# Patient Record
Sex: Female | Born: 1980 | Race: White | Hispanic: No | Marital: Single | Smoking: Never smoker
Health system: Southern US, Community
[De-identification: ages and names within clinical notes are randomized; demographics above are authoritative.]

## PROBLEM LIST (undated history)

## (undated) DIAGNOSIS — I10 Essential (primary) hypertension: Secondary | ICD-10-CM

## (undated) DIAGNOSIS — F1010 Alcohol abuse, uncomplicated: Secondary | ICD-10-CM

## (undated) DIAGNOSIS — G5602 Carpal tunnel syndrome, left upper limb: Secondary | ICD-10-CM

## (undated) DIAGNOSIS — Z8679 Personal history of other diseases of the circulatory system: Secondary | ICD-10-CM

---

## 2009-12-03 NOTE — ED Notes (Signed)
Pt given ice pack for left hand 

## 2009-12-03 NOTE — ED Notes (Signed)
Pt c/o left hand pain secondary to "getting mad and punching a cabinet" 20 minutes PTA. Hand has moderate swelling and bruising. +Pulses,

## 2009-12-04 MED ORDER — IBUPROFEN 800 MG TAB
800 mg | ORAL_TABLET | Freq: Four times a day (QID) | ORAL | Status: AC | PRN
Start: 2009-12-04 — End: 2009-12-11

## 2009-12-04 MED ORDER — HYDROCODONE-ACETAMINOPHEN 5 MG-325 MG TAB
5-325 mg | Freq: Once | ORAL | Status: AC | PRN
Start: 2009-12-04 — End: 2009-12-04
  Administered 2009-12-04: 04:00:00 via ORAL

## 2009-12-04 MED ORDER — HYDROCODONE-ACETAMINOPHEN 5 MG-500 MG TAB
5-500 mg | ORAL_TABLET | Freq: Four times a day (QID) | ORAL | Status: DC | PRN
Start: 2009-12-04 — End: 2011-11-01
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
## 2010-08-14 LAB — HCG URINE, QL. - POC: Pregnancy test,urine (POC): NEGATIVE

## 2010-08-14 MED ORDER — HYDROCODONE-ACETAMINOPHEN 5 MG-500 MG TAB
5-500 mg | ORAL_TABLET | Freq: Four times a day (QID) | ORAL | Status: DC | PRN
Start: 2010-08-14 — End: 2011-11-01

## 2010-08-14 NOTE — ED Notes (Signed)
Patient in xray
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
## 2010-08-14 NOTE — ED Notes (Signed)
Pt injured left elbow just PTA while lifting a large box out of her truck, when she dropped the box she hit her elbow on the bumper of her truck.  Swelling noted to left elbow with decreased movement.

## 2010-08-14 NOTE — ED Notes (Signed)
I have reviewed discharge instructions with the patient.  The patient verbalized understanding.  rx for vicodin given to patient.  Patient discharged ambulatory to care of self at door without incident

## 2011-11-01 LAB — HCG URINE, QL. - POC: Pregnancy test,urine (POC): NEGATIVE

## 2011-11-01 MED ADMIN — HYDROmorphone (PF) (DILAUDID) injection 1 mg: INTRAVENOUS | @ 06:00:00 | NDC 00409255201

## 2011-11-01 MED ADMIN — ondansetron (ZOFRAN) injection 4 mg: INTRAVENOUS | @ 06:00:00 | NDC 00409475503

## 2011-11-01 MED FILL — HYDROMORPHONE (PF) 1 MG/ML IJ SOLN: 1 mg/mL | INTRAMUSCULAR | Qty: 1

## 2011-11-01 MED FILL — ONDANSETRON (PF) 4 MG/2 ML INJECTION: 4 mg/2 mL | INTRAMUSCULAR | Qty: 2
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
## 2011-11-01 NOTE — ED Notes (Signed)
I have reviewed discharge instructions with the patient.  The patient verbalized understanding.  Patient given narcotics while in ED. Informed unable to drive self home. Pt. Discharged home with family/spouse.
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
## 2011-11-02 NOTE — ED Provider Notes (Signed)
I personally saw and examined the patient.  I have reviewed and agree with the MLP's findings, including all diagnostic interpretations, and plans as written.   I was present during the key portions of separately billed procedures.    Nazarene Bunning A Mahogany Torrance, MD
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
## 2012-05-16 NOTE — ED Notes (Signed)
Pt also c/o left sided rib and kidney pain. amb to triage.

## 2012-05-16 NOTE — ED Notes (Signed)
Pt in full view of nursing station. Accompanied by friend, who is supportive. Not currently vomiting. Observed pupils to be 3-4+ and equal. Steady gait.
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
## 2012-05-17 LAB — URINALYSIS W/ REFLEX CULTURE
Bacteria: NEGATIVE /hpf
Bilirubin: NEGATIVE
Blood: NEGATIVE
Glucose: NEGATIVE mg/dL
Ketone: NEGATIVE mg/dL
Leukocyte Esterase: NEGATIVE
Nitrites: NEGATIVE
Protein: NEGATIVE mg/dL
Specific gravity: 1.019 (ref 1.003–1.030)
Urobilinogen: 0.2 EU/dL (ref 0.2–1.0)
pH (UA): 5 (ref 5.0–8.0)

## 2012-05-17 LAB — HCG URINE, QL. - POC: Pregnancy test,urine (POC): NEGATIVE

## 2012-05-17 MED ADMIN — lidocaine/EPINEPHrine/tetracaine/methylcellulose (LET) topical gel gel 2 mL: TOPICAL | @ 04:00:00 | NDC 99991027902

## 2012-05-17 MED ADMIN — HYDROcodone-acetaminophen (NORCO) 5-325 mg per tablet 1 Tab: ORAL | @ 04:00:00 | NDC 68084036811

## 2012-05-17 MED ADMIN — ondansetron (ZOFRAN ODT) tablet 4 mg: ORAL | @ 04:00:00 | NDC 68462015740

## 2012-05-17 NOTE — ED Notes (Signed)
Visual acuity, bilateral-20/50, right eye-20-70, left eye-20/100. Pt does not wear glasses or contacts.

## 2012-05-17 NOTE — ED Notes (Signed)
I have reviewed discharge instructions with the patient.  The patient verbalized understanding.

## 2012-05-17 NOTE — ED Notes (Signed)
Pt now c/o blurry vision to the left eye, no trauma noted. Pt taken to a snellen chart and visual acuity was taken.  Pt's gait is steady. Educated pt to apply ice to her left eye upon returning home.
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
## 2013-09-17 NOTE — ED Notes (Signed)
**  Final Report**      ICD Codes / Adm.Diagnosis: 110002 / Abdominal Pain Abd pain  Examination: CT ABD PELVIS W CON - [id number] - Sep 17 2013 10:08PM  Accession No: [id number]  Reason: abdominal pain x 1 week      REPORT:  EXAM: CT ABDOMEN PELVIS WITH CONTRAST    INDICATION: Abdominal pain for one week.    COMPARISON: None.    CONTRAST: 95 mL of Isovue-370.     TECHNIQUE:   Multislice helical CT was performed from the diaphragm to the symphysis   pubis during uneventful rapid bolus intravenous contrast administration.   Oral contrast was also administered . Contiguous 5 mm axial images   were reconstructed and lung and soft tissue windows were generated. Coronal   and sagittal reformations were generated. CT dose reduction was achieved   through use of a standardized protocol tailored for this examination and   automatic exposure control for dose modulation. A bismuth breast shield was   used for this examination.    FINDINGS:      LOWER CHEST: The visualized portions of the lung bases are clear.     ABDOMEN:  Liver: The liver is normal in size and contour with no focal abnormality.     Gallbladder and bile ducts: There is no calcified gallstone or biliary   dilatation.    Spleen: No abnormality.    Pancreas: No abnormality.    Adrenal glands: No abnormality.    Kidneys: No abnormality.     PELVIS:  Reproductive organs: The uterus and left ovary are normal. There is a 6 x   5.1 x 6.8 cm right ovarian cyst.     Bladder: No abnormality.     BOWEL AND MESENTERY: The small bowel is normal. There is no mesenteric mass   or adenopathy. The appendix is not identified.     PERITONEUM: There is no ascites or free intraperitoneal air.    RETROPERITONEUM: The aorta tapers without aneurysm. There is no   retroperitoneal adenopathy or mass. There is no pelvic mass or adenopathy.    BONES AND SOFT TISSUES: The bones and soft tissues of the abdominal wall are   within normal limits.       IMPRESSION: Right ovarian cyst.           Signing/Reading Doctor: Carmon GinsbergICHARD A. SZUCS [phone]   Approved: Carmon GinsbergICHARD A. SZUCS [phone]([id number]) Sep 17 2013 10:31PM     Patient care assumed at 2215 - plan was check Ct and d/c home and UTI if CT neg  Ct shows ovarian cyst - will add analgesia to ABX rx

## 2013-09-17 NOTE — ED Notes (Signed)
RLQ pain times several days with no other symptoms.

## 2013-09-17 NOTE — ED Notes (Signed)
Urine preg test negative.

## 2013-09-17 NOTE — ED Provider Notes (Signed)
Patient is a 33 y.o. female presenting with abdominal pain. The history is provided by the patient. No language interpreter was used.   Abdominal Pain   This is a new problem. The current episode started more than 2 days ago. The problem occurs constantly. The problem has not changed since onset.The pain is located in the RLQ and suprapubic region. Associated symptoms include nausea. Pertinent negatives include no fever, no diarrhea, no melena, no vomiting, no constipation, no dysuria, no myalgias, no chest pain and no back pain. The pain is worsened by eating and urination. The pain is relieved by nothing. Past workup includes no CT scan, no ultrasound and no surgery. Her past medical history does not include PUD, GERD, UTI, ovarian cysts, atrial fibrillation or DM. The patient's surgical history non-contributory.       No past medical history on file.     Past Surgical History   Procedure Laterality Date   ??? Hx wisdom teeth extraction           No family history on file.     History     Social History   ??? Marital Status: DIVORCED     Spouse Name: N/A     Number of Children: N/A   ??? Years of Education: N/A     Occupational History   ??? Not on file.     Social History Main Topics   ??? Smoking status: Never Smoker    ??? Smokeless tobacco: Never Used   ??? Alcohol Use: 2.0 oz/week     4 Glasses of wine per week   ??? Drug Use: No   ??? Sexual Activity: Not on file     Other Topics Concern   ??? Not on file     Social History Narrative   ??? No narrative on file                  ALLERGIES: Ceclor      Review of Systems   Constitutional: Positive for appetite change (dec). Negative for fever and chills.   HENT: Negative for congestion.    Respiratory: Negative for cough and shortness of breath.    Cardiovascular: Negative for chest pain.   Gastrointestinal: Positive for nausea and abdominal pain. Negative for vomiting, diarrhea, constipation, blood in stool, melena and anal bleeding.    Genitourinary: Negative for dysuria, flank pain, vaginal bleeding, vaginal discharge, difficulty urinating and vaginal pain.   Musculoskeletal: Negative for myalgias and back pain.   Skin: Negative for rash.   Neurological: Negative for weakness.       Filed Vitals:    09/17/13 1952   BP: 134/79   Pulse: 90   Temp: 98.5 ??F (36.9 ??C)   Resp: 16   Height: 4\' 11"  (1.499 m)   Weight: 45.36 kg (100 lb)   SpO2: 95%            Physical Exam   Constitutional: She is oriented to person, place, and time. She appears well-developed and well-nourished.   Eyes: EOM are normal. Pupils are equal, round, and reactive to light.   Neck: Normal range of motion.   Cardiovascular: Normal rate, regular rhythm, normal heart sounds and intact distal pulses.    Pulmonary/Chest: Effort normal and breath sounds normal.   Abdominal: Soft. Bowel sounds are normal. There is tenderness (rt > left ttp) in the right lower quadrant, suprapubic area and left lower quadrant. There is no rebound and no CVA tenderness.   Neurological:  She is alert and oriented to person, place, and time.   Skin: Skin is warm and dry.   Psychiatric: She has a normal mood and affect. Her behavior is normal.   Nursing note and vitals reviewed.       MDM  Number of Diagnoses or Management Options  Diagnosis management comments: Uti, renal colic, appendicitis      Procedures    Case endorsed to Dr Para SkeansBartholomew, ct a/p, reeval pdg

## 2013-09-18 LAB — URINALYSIS W/ REFLEX CULTURE
Bilirubin: NEGATIVE
Blood: NEGATIVE
Glucose: NEGATIVE mg/dL
Ketone: NEGATIVE mg/dL
Leukocyte Esterase: NEGATIVE
Nitrites: NEGATIVE
Protein: NEGATIVE mg/dL
Specific gravity: 1.007 (ref 1.003–1.030)
Urobilinogen: 0.2 EU/dL (ref 0.2–1.0)
pH (UA): 7 (ref 5.0–8.0)

## 2013-09-18 LAB — CBC WITH AUTOMATED DIFF
ABS. BASOPHILS: 0 10*3/uL (ref 0.0–0.1)
ABS. EOSINOPHILS: 0.1 10*3/uL (ref 0.0–0.4)
ABS. LYMPHOCYTES: 1.5 10*3/uL (ref 0.8–3.5)
ABS. MONOCYTES: 0.9 10*3/uL (ref 0.0–1.0)
ABS. NEUTROPHILS: 6.1 10*3/uL (ref 1.8–8.0)
BASOPHILS: 0 % (ref 0–1)
EOSINOPHILS: 1 % (ref 0–7)
HCT: 36.9 % (ref 35.0–47.0)
HGB: 12.3 g/dL (ref 11.5–16.0)
LYMPHOCYTES: 17 % (ref 12–49)
MCH: 32.7 PG (ref 26.0–34.0)
MCHC: 33.3 g/dL (ref 30.0–36.5)
MCV: 98.1 FL (ref 80.0–99.0)
MONOCYTES: 11 % (ref 5–13)
NEUTROPHILS: 71 % (ref 32–75)
PLATELET: 227 10*3/uL (ref 150–400)
RBC: 3.76 M/uL — ABNORMAL LOW (ref 3.80–5.20)
RDW: 12.8 % (ref 11.5–14.5)
WBC: 8.7 10*3/uL (ref 3.6–11.0)

## 2013-09-18 LAB — POC CHEM8
Anion gap (POC): 16 mmol/L — ABNORMAL HIGH (ref 5–15)
BUN (POC): 12 MG/DL (ref 9–20)
CO2 (POC): 27 MMOL/L (ref 21–32)
Calcium, ionized (POC): 1.19 MMOL/L (ref 1.12–1.32)
Chloride (POC): 99 MMOL/L (ref 98–107)
Creatinine (POC): 0.9 MG/DL (ref 0.6–1.3)
GFRAA, POC: >60 mL/min/{1.73_m2} (ref >60)
GFRNA, POC: >60 mL/min/{1.73_m2} (ref >60)
Glucose (POC): 87 MG/DL (ref 65–105)
Hematocrit (POC): 37 % (ref 35.0–47.0)
Hemoglobin (POC): 12.6 GM/DL (ref 11.5–16.0)
Potassium (POC): 3.8 MMOL/L (ref 3.5–5.1)
Sodium (POC): 137 MMOL/L (ref 136–145)

## 2013-09-18 LAB — HCG URINE, QL. - POC: Pregnancy test,urine (POC): NEGATIVE

## 2013-09-18 MED ORDER — IBUPROFEN 600 MG TAB
600 mg | ORAL_TABLET | Freq: Four times a day (QID) | ORAL | Status: AC | PRN
Start: 2013-09-18 — End: ?

## 2013-09-18 MED ORDER — HYDROCODONE-ACETAMINOPHEN 7.5 MG-325 MG TAB
ORAL_TABLET | Freq: Four times a day (QID) | ORAL | Status: AC | PRN
Start: 2013-09-18 — End: ?

## 2013-09-18 MED ORDER — MORPHINE 4 MG/ML SYRINGE
4 mg/mL | Freq: Once | INTRAMUSCULAR | Status: AC
Start: 2013-09-18 — End: 2013-09-17
  Administered 2013-09-18: 03:00:00 via INTRAVENOUS

## 2013-09-18 MED ORDER — MORPHINE 2 MG/ML INJECTION
2 mg/mL | INTRAMUSCULAR | Status: AC
Start: 2013-09-18 — End: 2013-09-17
  Administered 2013-09-18: 01:00:00 via INTRAVENOUS

## 2013-09-18 MED ORDER — TRIMETHOPRIM-SULFAMETHOXAZOLE 160 MG-800 MG TAB
160-800 mg | ORAL_TABLET | Freq: Two times a day (BID) | ORAL | Status: AC
Start: 2013-09-18 — End: 2013-09-24

## 2013-09-18 MED ORDER — IOPAMIDOL 76 % IV SOLN
370 mg iodine /mL (76 %) | Freq: Once | INTRAVENOUS | Status: AC
Start: 2013-09-18 — End: 2013-09-17
  Administered 2013-09-18: 02:00:00 via INTRAVENOUS

## 2013-09-18 MED ORDER — ONDANSETRON (PF) 4 MG/2 ML INJECTION
4 mg/2 mL | INTRAMUSCULAR | Status: AC
Start: 2013-09-18 — End: 2013-09-17
  Administered 2013-09-18: 01:00:00 via INTRAVENOUS

## 2013-09-18 MED ADMIN — trimethoprim-sulfamethoxazole (BACTRIM DS, SEPTRA DS) 160-800 mg per tablet 1 Tab: ORAL | @ 03:00:00 | NDC 68084023011

## 2013-09-18 MED ADMIN — HYDROcodone-acetaminophen (NORCO) 5-325 mg per tablet 1 Tab: ORAL | @ 03:00:00 | NDC 00406012323

## 2013-09-18 MED ADMIN — sodium chloride 0.9 % bolus infusion 1,000 mL: INTRAVENOUS | @ 01:00:00 | NDC 00409798309

## 2013-09-18 MED FILL — BD POSIFLUSH NORMAL SALINE 0.9 % INJECTION SYRINGE: INTRAMUSCULAR | Qty: 10

## 2013-09-18 MED FILL — ONDANSETRON HCL (PF) 4 MG/2 ML SYRINGE: 4 mg/2 mL | INTRAMUSCULAR | Qty: 2

## 2013-09-18 MED FILL — MORPHINE 2 MG/ML INJECTION: 2 mg/mL | INTRAMUSCULAR | Qty: 1

## 2013-09-18 MED FILL — TRIMETHOPRIM-SULFAMETHOXAZOLE 160 MG-800 MG TAB: 160-800 mg | ORAL | Qty: 1

## 2013-09-18 MED FILL — HYDROCODONE-ACETAMINOPHEN 5 MG-325 MG TAB: 5-325 mg | ORAL | Qty: 1

## 2013-09-18 MED FILL — SODIUM CHLORIDE 0.9 % IV: INTRAVENOUS | Qty: 1000

## 2013-09-18 MED FILL — MORPHINE 4 MG/ML SYRINGE: 4 mg/mL | INTRAMUSCULAR | Qty: 1

## 2013-09-18 MED FILL — ISOVUE-370  76 % INTRAVENOUS SOLUTION: 370 mg iodine /mL (76 %) | INTRAVENOUS | Qty: 100

## 2013-09-19 LAB — CULTURE, URINE
Colonies Counted: >100000
Colony Count: >100000

## 2016-08-30 ENCOUNTER — Inpatient Hospital Stay: Admit: 2016-08-30 | Discharge: 2016-08-30 | Payer: PRIVATE HEALTH INSURANCE | Attending: Emergency Medicine

## 2016-08-30 DIAGNOSIS — F10129 Alcohol abuse with intoxication, unspecified: Secondary | ICD-10-CM

## 2016-08-30 LAB — METABOLIC PANEL, COMPREHENSIVE
A-G Ratio: 1 — ABNORMAL LOW (ref 1.1–2.2)
ALT (SGPT): 18 U/L (ref 12–78)
AST (SGOT): 23 U/L (ref 15–37)
Albumin: 3.4 g/dL — ABNORMAL LOW (ref 3.5–5.0)
Alk. phosphatase: 31 U/L — ABNORMAL LOW (ref 45–117)
Anion gap: 11 mmol/L (ref 5–15)
BUN/Creatinine ratio: 10 — ABNORMAL LOW (ref 12–20)
BUN: 7 MG/DL (ref 6–20)
Bilirubin, total: 0.6 MG/DL (ref 0.2–1.0)
CO2: 25 mmol/L (ref 21–32)
Calcium: 7.9 MG/DL — ABNORMAL LOW (ref 8.5–10.1)
Chloride: 104 mmol/L (ref 97–108)
Creatinine: 0.7 MG/DL (ref 0.55–1.02)
GFR est AA: >60 mL/min/{1.73_m2} (ref >60)
GFR est non-AA: >60 mL/min/{1.73_m2} (ref >60)
Globulin: 3.3 g/dL (ref 2.0–4.0)
Glucose: 86 mg/dL (ref 65–100)
Potassium: 3.9 mmol/L (ref 3.5–5.1)
Protein, total: 6.7 g/dL (ref 6.4–8.2)
Sodium: 140 mmol/L (ref 136–145)

## 2016-08-30 LAB — EKG, 12 LEAD, INITIAL
Atrial Rate: 106 {beats}/min
Calculated P Axis: 66 degrees
Calculated R Axis: 39 degrees
Calculated T Axis: 12 degrees
P-R Interval: 152 ms
Q-T Interval: 358 ms
QRS Duration: 72 ms
QTC Calculation (Bezet): 475 ms
Ventricular Rate: 106 {beats}/min

## 2016-08-30 LAB — HCG QL SERUM: HCG, Ql.: NEGATIVE

## 2016-08-30 LAB — CBC WITH AUTOMATED DIFF
ABS. BASOPHILS: 0.1 10*3/uL (ref 0.0–0.1)
ABS. EOSINOPHILS: 0 10*3/uL (ref 0.0–0.4)
ABS. IMM. GRANS.: 0 10*3/uL (ref 0.00–0.04)
ABS. LYMPHOCYTES: 2.3 10*3/uL (ref 0.8–3.5)
ABS. MONOCYTES: 0.3 10*3/uL (ref 0.0–1.0)
ABS. NEUTROPHILS: 4.8 10*3/uL (ref 1.8–8.0)
ABSOLUTE NRBC: 0 10*3/uL (ref 0.00–0.01)
BASOPHILS: 1 % (ref 0–1)
EOSINOPHILS: 1 % (ref 0–7)
HCT: 36.1 % (ref 35.0–47.0)
HGB: 11.7 g/dL (ref 11.5–16.0)
IMMATURE GRANULOCYTES: 0 % (ref 0.0–0.5)
LYMPHOCYTES: 30 % (ref 12–49)
MCH: 31.3 PG (ref 26.0–34.0)
MCHC: 32.4 g/dL (ref 30.0–36.5)
MCV: 96.5 FL (ref 80.0–99.0)
MONOCYTES: 3 % — ABNORMAL LOW (ref 5–13)
MPV: 9.2 FL (ref 8.9–12.9)
NEUTROPHILS: 64 % (ref 32–75)
NRBC: 0 PER 100 WBC
PLATELET: 355 10*3/uL (ref 150–400)
RBC: 3.74 M/uL — ABNORMAL LOW (ref 3.80–5.20)
RDW: 15 % — ABNORMAL HIGH (ref 11.5–14.5)
WBC: 7.5 10*3/uL (ref 3.6–11.0)

## 2016-08-30 LAB — ETHYL ALCOHOL: ALCOHOL(ETHYL),SERUM: 412 MG/DL — ABNORMAL HIGH (ref <10)

## 2016-08-30 LAB — TROPONIN I: Troponin-I, Qt.: <0.05 ng/mL (ref <0.05)

## 2016-08-30 LAB — EKG 12-LEAD
Atrial Rate: 106 {beats}/min
P Axis: 66 degrees
P-R Interval: 152 ms
Q-T Interval: 358 ms
QRS Duration: 72 ms
QTc Calculation (Bazett): 475 ms
R Axis: 39 degrees
T Axis: 12 degrees
Ventricular Rate: 106 {beats}/min

## 2016-08-30 MED ORDER — SODIUM CHLORIDE 0.9% BOLUS IV
0.9 % | Freq: Once | INTRAVENOUS | Status: AC
Start: 2016-08-30 — End: 2016-08-30
  Administered 2016-08-30: 19:00:00 via INTRAVENOUS

## 2016-08-30 MED ORDER — SODIUM CHLORIDE 0.9% BOLUS IV
0.9 % | INTRAVENOUS | Status: DC
Start: 2016-08-30 — End: 2016-08-30

## 2016-08-30 MED FILL — SODIUM CHLORIDE 0.9 % IV: INTRAVENOUS | Qty: 1000

## 2016-08-30 NOTE — ED Notes (Signed)
Patient removed IV per forensic nurse
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
## 2016-08-30 NOTE — ED Notes (Addendum)
Patient has left room prior to receiving discharge instructions from ER MD. Left prior to nurse removing IV.  Attempted to call patient at number provided in chart, wrong number.  Patient wife then looking in ED for patient.

## 2016-08-30 NOTE — ED Triage Notes (Signed)
Triage: pt had syncopal episode and fell face first down 3 concrete stairs. LOC lasted ~10 seconds. Pt admits to drinking 6 glasses of wine at 10am today. +chest pain intermittently, none now. Facial pain. Pt tearful and unable to answer many questions.
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
## 2016-08-30 NOTE — ED Notes (Signed)
Forensics nurse at bedside

## 2016-08-30 NOTE — ED Notes (Signed)
Change of shift. Care of patient taken over from Dr. Marella BileEljaiek; H&P reviewed, bedside handoff complete.  Awaiting forensics and EtOH metabolism d/t intoxication.  Note written by Janece CanterburyJay F Spangler, Scribe, as dictated by Marye RoundFrancis J Sidi Dzikowski, MD 4:08 PM    ED EKG interpretation:  Rhythm: sinus tachycardia; Rate (approx.): 101; ST/T wave: no ST/T wave changes;   Note written by Janece CanterburyJay F Spangler, Scribe, as dictated by Marye RoundFrancis J Sabrinia Prien, MD 5:30 PM    PROGRESS NOTE:  5:39 PM  Pt has eloped.
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
## 2016-08-31 LAB — EKG, 12 LEAD, INITIAL
Atrial Rate: 101 {beats}/min
Calculated P Axis: 75 degrees
Calculated R Axis: 52 degrees
Calculated T Axis: 17 degrees
P-R Interval: 152 ms
Q-T Interval: 370 ms
QRS Duration: 68 ms
QTC Calculation (Bezet): 479 ms
Ventricular Rate: 101 {beats}/min

## 2016-08-31 LAB — EKG 12-LEAD
Atrial Rate: 101 {beats}/min
P Axis: 75 degrees
P-R Interval: 152 ms
Q-T Interval: 370 ms
QRS Duration: 68 ms
QTc Calculation (Bazett): 479 ms
R Axis: 52 degrees
T Axis: 17 degrees
Ventricular Rate: 101 {beats}/min

## 2017-08-08 ENCOUNTER — Emergency Department (HOSPITAL_COMMUNITY)
Admission: EM | Admit: 2017-08-08 | Discharge: 2017-08-09 | Disposition: A | Discharge status: Home / Self Care | Payer: Self-pay | Attending: Emergency Medicine | Admitting: Emergency Medicine

## 2017-08-08 DIAGNOSIS — F1010 Alcohol abuse, uncomplicated: Secondary | ICD-10-CM | POA: Insufficient documentation

## 2017-08-08 DIAGNOSIS — I10 Essential (primary) hypertension: Secondary | ICD-10-CM | POA: Insufficient documentation

## 2017-08-08 DIAGNOSIS — M79671 Pain in right foot: Secondary | ICD-10-CM | POA: Insufficient documentation

## 2017-08-08 HISTORY — DX: Essential (primary) hypertension: I10

## 2017-08-08 HISTORY — DX: Alcohol abuse, uncomplicated: F10.10

## 2017-08-08 NOTE — ED Notes (Signed)
No answer x1

## 2017-08-08 NOTE — ED Notes (Signed)
No answer in waiting room x2.

## 2017-08-09 ENCOUNTER — Encounter (HOSPITAL_COMMUNITY): Payer: Self-pay | Admitting: Emergency Medicine

## 2017-08-09 ENCOUNTER — Emergency Department (HOSPITAL_COMMUNITY): Payer: Self-pay

## 2017-08-09 ENCOUNTER — Other Ambulatory Visit: Payer: Self-pay

## 2017-08-09 LAB — I-STAT BETA HCG BLOOD, ED (MC, WL, AP ONLY)

## 2017-08-09 LAB — COMPREHENSIVE METABOLIC PANEL
ALK PHOS: 37 U/L — AB (ref 38–126)
ALT: 17 U/L (ref 14–54)
ANION GAP: 12 (ref 5–15)
AST: 31 U/L (ref 15–41)
Albumin: 4.2 g/dL (ref 3.5–5.0)
BILIRUBIN TOTAL: 0.9 mg/dL (ref 0.3–1.2)
BUN: 12 mg/dL (ref 6–20)
CALCIUM: 9.1 mg/dL (ref 8.9–10.3)
CO2: 25 mmol/L (ref 22–32)
Chloride: 107 mmol/L (ref 101–111)
Creatinine, Ser: 0.75 mg/dL (ref 0.44–1.00)
GFR calc Af Amer: >60 mL/min (ref >60)
Glucose, Bld: 94 mg/dL (ref 65–99)
POTASSIUM: 4.2 mmol/L (ref 3.5–5.1)
Sodium: 144 mmol/L (ref 135–145)
TOTAL PROTEIN: 7.3 g/dL (ref 6.5–8.1)

## 2017-08-09 LAB — CBC
HCT: 41.9 % (ref 36.0–46.0)
HEMOGLOBIN: 13.7 g/dL (ref 12.0–15.0)
MCH: 31.2 pg (ref 26.0–34.0)
MCHC: 32.7 g/dL (ref 30.0–36.0)
MCV: 95.4 fL (ref 78.0–100.0)
PLATELETS: 356 10*3/uL (ref 150–400)
RBC: 4.39 MIL/uL (ref 3.87–5.11)
RDW: 14 % (ref 11.5–15.5)
WBC: 7.3 10*3/uL (ref 4.0–10.5)

## 2017-08-09 LAB — LIPASE, BLOOD: Lipase: 38 U/L (ref 11–51)

## 2017-08-09 LAB — ETHANOL: Alcohol, Ethyl (B): 399 mg/dL (ref <10)

## 2017-08-09 MED ORDER — ACETAMINOPHEN 500 MG PO TABS
500.0000 mg | ORAL_TABLET | Freq: Once | ORAL | Status: AC
  Administered 2017-08-09: 500 mg via ORAL
  Filled 2017-08-09: qty 1

## 2017-08-09 NOTE — Discharge Instructions (Signed)
Your blood work is reassuring.  [facility].  In terms of your right foot pain this seems to be chronic.  Motrin and Tylenol for pain.  Please follow-up podiatry.  He develop any chest pain or shortness of breath return the ED for further work-up.

## 2017-08-09 NOTE — ED Notes (Signed)
DC instructions reviewed, verbalizes understanding. Opportunity for questions provided, denies.

## 2017-08-09 NOTE — ED Triage Notes (Signed)
Requesting detox from ETOH.  Pt states she drinks "a lot".  Last etoh 1 hour ago.  Denies SI/HI.  States she has been through inpatient detox before but doesn't remember how long ago.
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
## 2019-07-05 NOTE — ED Provider Notes (Signed)
Formatting of this note is different from the original.  History     Chief Complaint   Patient presents with   ? Altered Mental Status     pt here for AMS, hx of TBI, pt is stooped over with a continuous rocking motion, pt not speaking at this time, grabbing at her head due to pain     History provided by:  Patient  Altered Mental Status  The primary symptoms include headaches, altered mental status and dizziness. Primary symptoms do not include syncope, loss of consciousness, seizures, visual change, paresthesias, focal weakness, loss of sensation, speech change, memory loss, fever, nausea, vomiting or syncope. The symptoms began yesterday. The symptoms are unchanged. The neurological symptoms are diffuse.   The pain from the headache is at a severity of 10/10. Location/region(s) of the headache: frontal. The headache is not associated with visual change or paresthesias.   Dizziness does not occur with nausea or vomiting.     Past Medical History:   ? Acute pancreatitis   ? Allergy   ? Anemia   ? Anxiety   ? Arthritis   ? Blood transfusion reaction   ? Chest pain   ? Clotting disorder (CMS-HCC)   ? Depression   ? Esophageal reflux   ? Essential hypertension   ? Fall   ? Fatigue   ? Open wound   ? SAH (subarachnoid hemorrhage) (CMS-HCC)     Past Surgical History:   Procedure Laterality Date   ? shrapnel removal from right foot   2015     Family History   Problem Relation Age of Onset   ? Alcohol abuse Mother    ? Arthritis Mother    ? Depression Mother    ? Drug abuse Mother    ? Hypertension Mother    ? Mental illness Mother    ? Heart disease Father    ? Hypertension Father    ? Alcohol abuse Sister    ? Asthma Sister    ? Depression Sister    ? Drug abuse Sister    ? Hypertension Sister    ? Mental illness Sister    ? Lupus Maternal Aunt    ? Arthritis Maternal Grandmother    ? Asthma Maternal Grandmother    ? Autoimmune disease Maternal Grandmother    ? Cancer Maternal Grandmother    ? Depression Maternal  Grandmother    ? Diabetes Maternal Grandmother    ? Heart disease Maternal Grandmother    ? Hypertension Maternal Grandmother    ? Arthritis Maternal Grandfather    ? Heart disease Maternal Grandfather    ? Stroke Maternal Grandfather    ? Arthritis Paternal Grandmother    ? Alcohol abuse Paternal Grandmother    ? Heart disease Paternal Grandmother    ? Hypertension Paternal Grandmother    ? Learning disabilities Paternal Grandmother    ? Arthritis Paternal Grandfather    ? Alcohol abuse Paternal Grandfather    ? Heart disease Paternal Grandfather    ? Hypertension Paternal Grandfather    ? Kidney disease Paternal Grandfather    ? Stroke Paternal Grandfather    ? Depression Sister    ? Hypertension Sister      Social History     Socioeconomic History   ? Marital status: Married     Spouse name: Not on file   ? Number of children: Not on file   ? Years of education: Not on file   ?  Highest education level: Not on file   Tobacco Use   ? Smoking status: Never Smoker   ? Smokeless tobacco: Never Used   Substance and Sexual Activity   ? Alcohol use: Yes     Alcohol/week: 6.0 standard drinks     Types: 6 Glasses of wine per week     Comment: bottle of wine day   ? Drug use: No     Comment: vape cbd for joint pain and anxiety   ? Sexual activity: Yes     Partners: Female     Birth control/protection: None     Additional Smoking     E-Cigarettes/Vaping Use   ? E-Cigarette/Vaping Use current every day user      E-Cigarette/Vaping Substances     Review of Systems   Constitutional: Negative for fever.   Cardiovascular: Negative for syncope.   Gastrointestinal: Negative for nausea and vomiting.   Neurological: Positive for dizziness and headaches. Negative for speech change, focal weakness, seizures, loss of consciousness and paresthesias.   Psychiatric/Behavioral: Negative for memory loss.   All other systems reviewed and are negative.    Physical Exam     ED Triage Vitals [07/05/19 1903]   BP 102/67   Heart Rate 107   Resp 16    Temp 37 C (98.6 F)   SpO2 96 %   O2 Device NC   O2 Flow Rate (L/min)      Physical Exam  Constitutional:       Comments: Physical examination  PE Limited by: Intoxication  Constitutional: Oriented, Alert, in NAD,  intoxicated appearing  Eyes: EOMI, PERRL  Ear Nose Mouth Throat: Ear, nose and throat exam normal, Mouth and pharynx normal, Mucous membranes moist  Neck: Normal, Non-tender  Cardiovascular: Regular rate and rhythm, Distal pulses normal  Respiratory: Breath sounds equal bilaterally, Normal respiratory effort/excursion  Chest: Non-tender  Gastrointestinal: Normal, Nontender  Musculoskeletal: Back nontender, Joints nontender, Joints without Deformity, Neck nontender  Neuro: Motor intact in all extremities; somnelent  Head: atraumatic, normocephalic  Extremity Exam: Distal neurovascular intact, No abnormalities, Range of Motion-Full  Psychiatric:  No suicidal ideation      ED Course     Procedure(s):  Procedures    EKG:  No results found for this visit on 07/05/19.        Laboratory Data:  Results for orders placed or performed during the hospital encounter of 07/05/19   CBC w/auto diff    Collection Time: 07/05/19 19:59   Result Value Ref Range    WBC 7.9 4.5 - 11.0 K/uL    RBC 4.61 4.00 - 5.20 M/uL    Hemoglobin 14.0 12.0 - 16.0 g/dL    Hematocrit 43.4 36.0 - 46.0 %    MCV 94.1 80.0 - 98.0 fL    MCH 30.4 25.0 - 35.0 pg    MCHC 32.3 30.0 - 36.0 g/dL    RDW 13.4 11.5 - 14.6 %    MPV 9.4 No Established Reference Range fL    Neutrophils % 54.7 40.0 - 70.0 %    Lymphocytes % 38.7 15.0 - 45.0 %    Monocytes % 4.3 1.0 - 8.0 %    Eosinophils % 1.5 0.0 - 6.0 %    BASO % 0.8 0.0 - 2.0 %    MONO # 0.3 No Established Reference Range K/uL    EOS # 0.1 No Established Reference Range K/uL    BASO # 0.1 No Established Reference Range K/uL  Platelets 379 150 - 450 K/uL    nRBC 0 0 - 0 /100WBC    LYMPH # 3.1 No Established Reference Range K/uL    ABS NEUT # 4.35 No Established Reference Range K/uL   Comprehensive  metabolic panel    Collection Time: 07/05/19 19:59   Result Value Ref Range    Glucose 100.73 74 - 106 mg/dL    Sodium 148.66 (H) 137 - 145 mMOL/L    Potassium 4.3 3.5 - 5.1 mMOL/L    Chloride 106.69 98 - 107 mMOL/L    CO2 23 21 - 31 mMOL/L    Anion Gap 23 (H) 10 - 20    BUN 10 7 - 18 mg/dL    Creatinine 0.67 0.52 - 1.25 mg/dL    EGFR 99.6 >60.0 mL/min/1.73*2    Calcium 10.5 8.5 - 10.5 mg/dL    Protein 9.1 (H) 6.0 - 8.4 g/dL    Albumin 5.1 (H) 3.0 - 5.0 g/dL    A/G Ratio 1.3 >=1.1    Globulin 4.0 (H) 1.5 - 3.8 g/dL    Total Bilirubin 0.3 0.2 - 1.3 mg/dL    AST 29 5 - 49 U/L    Alkaline Phosphatase 46.34 38 - 126 U/L    ALT 11 3 - 35 U/L   Ethanol    Collection Time: 07/05/19 19:59   Result Value Ref Range    Ethanol 314 (HH) Non-Detectable (<10) mg/dL   Protime-INR    Collection Time: 07/05/19 19:59   Result Value Ref Range    PT Patient 10.1 9.9 - 13.3 Sec    INR 0.90 0.89 - 1.19   APTT    Collection Time: 07/05/19 19:59   Result Value Ref Range    PTT Patient 31.3 26.2 - 37.2 Sec     Imaging:  Results for orders placed or performed during the hospital encounter of 07/05/19   CT head without contrast    Narrative     [id number]    HISTORY:  Pain and altered mental status.    COMPARISON:  CT head-May 19, 2019    CT HEAD WO CONTRAST    TECHNIQUE: Thinly collimated spiral CT images of the brain without contrast with axial, coronal and sagittal reformations.      FINDINGS:    BRAIN PARENCHYMA: There is no evidence of intracranial hemorrhage, loss of distinction between gray and white matter, mass, significant mass effect, or abnormal extra-axial collection.    VENTRICLES/EXTRA-AXIAL SPACES: The ventricles are normal in size. The basal cisterns are patent.     The density of the dural venous sinuses is normal.    The skull base and calvarium are normal.    EXTRACRANIAL STRUCTURES: Paranasal sinuses and mastoid air cells are predominantly clear. The visualized orbits are unremarkable.     Impression    No acute  intracranial findings.    Pamella Pert  07/05/2019 8:35 PM    '@1' @    The attending radiologist has reviewed all images, agrees with the interpretation, and provided appropriate supervision for this exam.    Interpreted By: Pamella Pert   07/05/2019 8:35 PM                                 Electronically signed by: Evelena Asa, MD  07/05/2019 8:47 PM     Most Recent Vitals:  BP: 102/67 (07/05/19 1903)  Heart Rate: 107 (07/05/19 1903)  Resp: 16 (  07/05/19 1903)  Temp: 38 C (98.6 F) (07/05/19 1903)  SpO2: 96 % (07/05/19 1903)  O2 Device: Nasal cannula (07/05/19 1903)        MDM  Number of Diagnoses or Management Options  Alcoholic intoxication with complication (CMS-HCC)  Nonintractable headache, unspecified chronicity pattern, unspecified headache type  Diagnosis management comments: Differential Diagnosis that have been ruled out based on HPI,ROS, and PE: Brain abscess, Brain cancer, Carotid artery dissection, Glaucoma, Head injury, Intracranial hemorrhage, Meningitis, Subarachnoid hemorrhage, Temporal arteritis;  Sinus venous thrombosis    Differential Diagnosis: Benign exertional headache, Brain tumor, Cluster headache, Complicated migraine, Migraine, Muscle contraction headache, Sinus headache, Tension-type headache,  stress    This patient presents with a headache that is not the worst headache of their life and was not sudden in onset. The patient has a normal neurologic exam without any meningismus, and therefore I do not suspect meningitis, subarachnoid hemorrhage, subdural, epidural hematomas. Given the patient's complaint I will check a head CT to rule out a mass and bleed and if the head CT is negative the patient will be discharged home with very strict return precautions and followup instructions. Again at this time given the patient's history and physical exam I do not think a lumbar puncture is necessary to exclude a subarachnoid hemorrhage, however I have educated the patient on signs and symptoms of  a subarachnoid hemorrhage. I have also thought about other life-threatening causes of headache including but not limited to: Sinus venous thrombosis, stroke, temporal arteritis, acute angle closure glaucoma, brainstem mass, and I do not feel that the patient's presentation fits with any of the above diagnoses.;     Patient's CT brain was unremarkable.  She was significantly intoxicated with alcohol level over 300.  She was allowed to metabolize and discharged in stable condition.      Amount and/or Complexity of Data Reviewed  Clinical lab tests: reviewed and ordered  Tests in the radiology section of CPT: ordered and reviewed  Tests in the medicine section of CPT: ordered and reviewed  Review and summarize past medical records: yes    ED CONSULT    None      ED REEVALUATION    None      Clinical Impression:  Diagnoses     Diagnosis Comment Added By Time Added    Alcoholic intoxication with complication (CMS-HCC)  Ailene Ards, MD 07/05/2019 20:58    Nonintractable headache, unspecified chronicity pattern, unspecified headache type  Ailene Ards, MD 07/05/2019 20:58       Disposition:   Discharge  Stable      Ailene Ards, MD  07/05/19 2245    Electronically signed by Ailene Ards, MD at 07/05/2019 22:45 EDT

## 2020-12-28 DIAGNOSIS — I959 Hypotension, unspecified: Secondary | ICD-10-CM

## 2020-12-28 NOTE — ED Provider Notes (Signed)
40 year old transgender female with history of HTN, TBI, anxiety presenting to the ED via EMS after syncopal episode.  Patient says that had feeling as though her vision was blacking out, felt lightheaded, and had witnessed syncopal episode.  Episode was brief, patient was not confused afterwards.  Currently has generalized weakness but denies any other symptoms.  Since that initial episode she has had 4 more episodes where she has felt very lightheaded like she was going to pass out that lasted approximately 1 minute each but has not syncopized again.  Per EMS, patient has had systolic blood pressure in the 70s to 100s, had thready radial pulses initially, has received 400 cc of IV fluids and several doses of push epinephrine to improve her blood pressures on route.  Last blood pressure was in the 70V systolic.  No chest pain, difficulty breathing, headache, numbness, weakness, vision changes, abdominal pain, urinary symptoms, bowel symptoms.  Has some residual word finding difficulty from previous TBI but denies any change with this.  No history of similar symptoms, no recent medication changes.  She does use marijuana and drinks socially, drink 2 beers tonight and use marijuana both of which are typical for her.  She does have history of upper GI bleed related to gastritis but denies any recent melena or blood in her stool.    The history is provided by the patient and the EMS personnel.   Syncope   Associated symptoms include light-headedness. Pertinent negatives include no chest pain, no confusion, no fever, no abdominal pain, no nausea, no vomiting, no congestion, no headaches, no back pain and no weakness. Her past medical history is significant for syncope.   Hypotension   Pertinent negatives include no confusion, no weakness, no agitation and no numbness. Functional status baseline:  [EPIC#1537^NOTE}      Past Medical History:   Diagnosis Date    Hypertension        Past Surgical History:   Procedure  Laterality Date    HX WISDOM TEETH EXTRACTION           No family history on file.    Social History     Socioeconomic History    Marital status: DIVORCED     Spouse name: Not on file    Number of children: Not on file    Years of education: Not on file    Highest education level: Not on file   Occupational History    Not on file   Tobacco Use    Smoking status: Never    Smokeless tobacco: Never   Substance and Sexual Activity    Alcohol use: Yes     Alcohol/week: 6.0 standard drinks     Types: 6 Glasses of wine per week    Drug use: No    Sexual activity: Not on file   Other Topics Concern    Not on file   Social History Narrative    Not on file     Social Determinants of Health     Financial Resource Strain: Not on file   Food Insecurity: Not on file   Transportation Needs: Not on file   Physical Activity: Not on file   Stress: Not on file   Social Connections: Not on file   Intimate Partner Violence: Not on file   Housing Stability: Not on file         ALLERGIES: Ceclor [cefaclor]    Review of Systems   Constitutional:  Negative for chills and fever.  HENT:  Negative for congestion and rhinorrhea.    Respiratory:  Negative for cough and shortness of breath.    Cardiovascular:  Positive for syncope. Negative for chest pain and leg swelling.   Gastrointestinal:  Negative for abdominal pain, constipation, diarrhea, nausea and vomiting.   Genitourinary:  Negative for difficulty urinating, dysuria and hematuria.   Musculoskeletal:  Negative for back pain and neck pain.   Skin:  Negative for color change and rash.   Neurological:  Positive for syncope and light-headedness. Negative for weakness, numbness and headaches.   Psychiatric/Behavioral:  Negative for agitation and confusion.      Vitals:    12/28/20 2308   Pulse: 87            Physical Exam  Constitutional:       General: She is not in acute distress.     Appearance: She is well-developed.   HENT:      Head: Normocephalic and atraumatic.   Eyes:      General:  No scleral icterus.     Pupils: Pupils are equal, round, and reactive to light.   Neck:      Trachea: No tracheal deviation.   Cardiovascular:      Rate and Rhythm: Normal rate and regular rhythm.      Heart sounds: No murmur heard.    No friction rub. No gallop.   Pulmonary:      Effort: Pulmonary effort is normal. No respiratory distress.      Breath sounds: Normal breath sounds. No wheezing or rales.   Abdominal:      General: Bowel sounds are normal. There is no distension.      Palpations: Abdomen is soft.      Tenderness: There is no abdominal tenderness.   Musculoskeletal:         General: No deformity.      Cervical back: Neck supple.   Skin:     General: Skin is warm and dry.   Neurological:      Mental Status: She is alert and oriented to person, place, and time.      Cranial Nerves: No cranial nerve deficit.      Sensory: No sensory deficit.      Motor: No weakness.   Psychiatric:         Behavior: Behavior normal.        MDM  Number of Diagnoses or Management Options  Hypotension, unspecified hypotension type  Lactic acidosis  Diagnosis management comments: 40 year old female presenting to the ED with syncope and hypotension.  Blood pressure in the 80s over 50s, otherwise normal vital signs and benign exam.  Differential includes hypovolemia, GI bleed, heart failure, polysubstance use.  Will check basic labs, troponin, proBNP, lactic acid.  Will treat with IV fluids and reevaluate.  History is not consistent with sepsis, will defer antibiotics at this time.  Dispo pending labs, imaging, response to treatment.       Amount and/or Complexity of Data Reviewed  Clinical lab tests: ordered and reviewed  Tests in the radiology section of CPT??: ordered and reviewed      ED Course as of 12/29/20 0121   Thu Dec 28, 2020   2311 ED EKG interpretation:11:11 PM  Rhythm: normal sinus rhythm;  Rate (approx.): 86; Axis: normal; QRS interval: normal ; ST/T wave: normal; Other findings: normal.    [JW]   Fri Dec 29, 2020    0028 Patient remains hypotensive. Giving 2nd liter of fluid.  Given patient on hormone therapy will treat with 100 mg of hydrocortisone for possible adrenal insufficiency. [JW]   0120 1:20 AM  Received stress dose steroids approximately 30 minutes ago.  Last 3 MAPs have been above 65.  Will admit to the hospitalist. [JW]      ED Course User Index  [JW] Deitra Mayo, MD       Procedures    1:21 AM    Admission Note:  Patient is being admitted to the hospital, Service: Hospitalist.  The results of their tests and reasons for their admission have been discussed with them and available family. They convey agreement and understanding for the need to be admitted and for their admission diagnosis.     LABORATORY TESTS:  Labs Reviewed   METABOLIC PANEL, COMPREHENSIVE - Abnormal; Notable for the following components:       Result Value    Glucose 120 (*)     Creatinine 1.08 (*)     Calcium 8.3 (*)     Alk. phosphatase 44 (*)     Protein, total 6.3 (*)     Albumin 3.2 (*)     A-G Ratio 1.0 (*)     All other components within normal limits   ETHYL ALCOHOL - Abnormal; Notable for the following components:    ALCOHOL(ETHYL),SERUM 62 (*)     All other components within normal limits   LACTIC ACID - Abnormal; Notable for the following components:    Lactic acid 2.6 (*)     All other components within normal limits   CULTURE, BLOOD, PAIRED   CBC WITH AUTOMATED DIFF   TROPONIN-HIGH SENSITIVITY   NT-PRO BNP   MAGNESIUM   SAMPLES BEING HELD   URINALYSIS W/ RFLX MICROSCOPIC   HCG URINE, QL       IMAGING RESULTS:  XR CHEST PORT    Result Date: 12/28/2020  EXAM:  XR CHEST PORT INDICATION: Syncope COMPARISON: March 2014 TECHNIQUE: portable chest AP view FINDINGS: The cardiac silhouette is within normal limits. The pulmonary vasculature is within normal limits. The lungs and pleural spaces are clear. The visualized bones and upper abdomen are age-appropriate.     No acute process on portable chest.      MEDICATIONS GIVEN:  Medications    NOREPINephrine (LEVOPHED) 8 mg in 5% dextrose 245m (32 mcg/mL) infusion (has no administration in time range)   sodium chloride 0.9 % bolus infusion 1,000 mL (1,000 mL IntraVENous New Bag 12/29/20 0019)   sodium chloride 0.9 % bolus infusion 1,000 mL (1,000 mL IntraVENous New Bag 12/29/20 0020)   hydrocortisone Sod Succ (PF) (SOLU-CORTEF) injection 100 mg (100 mg IntraVENous Given 12/29/20 0034)       IMPRESSION:  1. Hypotension, unspecified hypotension type    2. Lactic acidosis        PLAN:  - Admit to hospitalist    CONDITION:  stable    Total critical care time (not including time spent performing separately reportable procedures): 4[place] MD      Hospitalist Perfect Serve for Admission  1:21 AM    ED Room Number: ER09/09  Patient Name and age:  Maria Goswami89y.o.  female  Working Diagnosis:   1. Hypotension, unspecified hypotension type    2. Lactic acidosis        COVID-19 Suspicion:  no    Code Status:  Full Code  Readmission: no  Isolation Requirements:  no  Recommended Level of Care:  step down  Department:[facility] - [id number]  Other:  Hx HTN, female to female transgender on hormone therapy. Syncopal episode, generally weak afterwards, MAPs initially in the 40's-50's. No significant improvement with fluids, received 0.5 L with EMS and 2 L with [place]. Given 100 mg of hydrocort with improvement in pressure, last 3 MAPs have been over 65. Suspect adrenal insufficiency related to hormone therapy. No suspicion for sepsis, felt normal before syncopal episode, lactic somewhat elevated but WBC normal and no other Sx's.    Signed By: Deitra Mayo, MD     December 29, 2020

## 2020-12-28 NOTE — ED Notes (Signed)
Pt. Brought in by EMS, for syncopal episode, pt. Received 5 mg of epi in route due to low BP. Pt. Has hx of TBI. Pt. Takes medication for Hypertension. BP in the 70's per EMS. BS 130's.

## 2020-12-29 ENCOUNTER — Inpatient Hospital Stay
Admit: 2020-12-29 | Discharge: 2020-12-29 | Disposition: A | Discharge status: Home / Self Care | Payer: PRIVATE HEALTH INSURANCE | Attending: Internal Medicine | Admitting: Internal Medicine

## 2020-12-29 LAB — METABOLIC PANEL, BASIC
Anion gap: 6 mmol/L (ref 5–15)
BUN/Creatinine ratio: 15 (ref 12–20)
BUN: 12 MG/DL (ref 6–20)
CO2: 27 mmol/L (ref 21–32)
Calcium: 8.2 MG/DL — ABNORMAL LOW (ref 8.5–10.1)
Chloride: 107 mmol/L (ref 97–108)
Creatinine: 0.81 MG/DL (ref 0.55–1.02)
Glucose: 117 mg/dL — ABNORMAL HIGH (ref 65–100)
Potassium: 4.2 mmol/L (ref 3.5–5.1)
Sodium: 140 mmol/L (ref 136–145)
eGFR: >60 mL/min/{1.73_m2} (ref >60)

## 2020-12-29 LAB — URINALYSIS W/ RFLX MICROSCOPIC
BACTERIA, URINE: NEGATIVE /hpf
Bacteria: NEGATIVE /[HPF]
Bilirubin, Urine: NEGATIVE
Bilirubin: NEGATIVE
Blood, Urine: NEGATIVE
Blood: NEGATIVE
Glucose, Ur: 100 mg/dL — AB
Glucose: 100 mg/dL — AB
Ketone: 15 mg/dL — AB
Ketones, Urine: 15 mg/dL — AB
Nitrite, Urine: NEGATIVE
Nitrites: NEGATIVE
Protein, UA: NEGATIVE mg/dL
Protein: NEGATIVE mg/dL
Specific Gravity, UA: 1.02 (ref 1.003–1.030)
Specific gravity: 1.02 (ref 1.003–1.030)
Urobilinogen, UA, POCT: 0.2 EU/dL (ref 0.2–1.0)
Urobilinogen: 0.2 U/dL (ref 0.2–1.0)
pH (UA): 6 (ref 5.0–8.0)
pH, UA: 6 (ref 5.0–8.0)

## 2020-12-29 LAB — MAGNESIUM
Magnesium: 2 mg/dL (ref 1.6–2.4)
Magnesium: 2 mg/dL (ref 1.6–2.4)

## 2020-12-29 LAB — EKG, 12 LEAD, INITIAL
Atrial Rate: 86 {beats}/min
Calculated P Axis: 65 degrees
Calculated R Axis: 46 degrees
Calculated T Axis: 19 degrees
Diagnosis: NORMAL
P-R Interval: 180 ms
Q-T Interval: 382 ms
QRS Duration: 88 ms
QTC Calculation (Bezet): 457 ms
Ventricular Rate: 86 {beats}/min

## 2020-12-29 LAB — CBC WITH AUTOMATED DIFF
ABS. BASOPHILS: 0.1 10*3/uL (ref 0.0–0.1)
ABS. EOSINOPHILS: 0.4 10*3/uL (ref 0.0–0.4)
ABS. IMM. GRANS.: 0 10*3/uL (ref 0.00–0.04)
ABS. LYMPHOCYTES: 2 10*3/uL (ref 0.8–3.5)
ABS. MONOCYTES: 0.6 10*3/uL (ref 0.0–1.0)
ABS. NEUTROPHILS: 5.7 10*3/uL (ref 1.8–8.0)
ABSOLUTE NRBC: 0 10*3/uL (ref 0.00–0.01)
BASOPHILS: 1 % (ref 0–1)
EOSINOPHILS: 4 % (ref 0–7)
HCT: 42.4 % (ref 35.0–47.0)
HGB: 13.7 g/dL (ref 11.5–16.0)
IMMATURE GRANULOCYTES: 0 % (ref 0.0–0.5)
LYMPHOCYTES: 23 % (ref 12–49)
MCH: 31.4 pg (ref 26.0–34.0)
MCHC: 32.3 g/dL (ref 30.0–36.5)
MCV: 97.2 fL (ref 80.0–99.0)
MONOCYTES: 7 % (ref 5–13)
MPV: 9.8 fL (ref 8.9–12.9)
NEUTROPHILS: 65 % (ref 32–75)
NRBC: 0 /100{WBCs}
PLATELET: 328 10*3/uL (ref 150–400)
RBC: 4.36 M/uL (ref 3.80–5.20)
RDW: 13.2 % (ref 11.5–14.5)
WBC: 8.9 10*3/uL (ref 3.6–11.0)

## 2020-12-29 LAB — METABOLIC PANEL, COMPREHENSIVE
A-G Ratio: 1 — ABNORMAL LOW (ref 1.1–2.2)
ALT (SGPT): 32 U/L (ref 12–78)
AST (SGOT): 36 U/L (ref 15–37)
Albumin: 3.2 g/dL — ABNORMAL LOW (ref 3.5–5.0)
Alk. phosphatase: 44 U/L — ABNORMAL LOW (ref 45–117)
Anion gap: 10 mmol/L (ref 5–15)
BUN/Creatinine ratio: 16 (ref 12–20)
BUN: 17 mg/dL (ref 6–20)
Bilirubin, total: 0.3 mg/dL (ref 0.2–1.0)
CO2: 26 mmol/L (ref 21–32)
Calcium: 8.3 MG/DL — ABNORMAL LOW (ref 8.5–10.1)
Chloride: 103 mmol/L (ref 97–108)
Creatinine: 1.08 MG/DL — ABNORMAL HIGH (ref 0.55–1.02)
Globulin: 3.1 g/dL (ref 2.0–4.0)
Glucose: 120 mg/dL — ABNORMAL HIGH (ref 65–100)
Potassium: 3.7 mmol/L (ref 3.5–5.1)
Protein, total: 6.3 g/dL — ABNORMAL LOW (ref 6.4–8.2)
Sodium: 139 mmol/L (ref 136–145)
eGFR: >60 mL/min/{1.73_m2} (ref >60)

## 2020-12-29 LAB — ETHYL ALCOHOL
ALCOHOL(ETHYL),SERUM: 62 MG/DL — ABNORMAL HIGH (ref <10)
Ethyl Alcohol: 62 MG/DL — ABNORMAL HIGH (ref <10)

## 2020-12-29 LAB — TROPONIN-HIGH SENSITIVITY: Troponin-High Sensitivity: 5 ng/L (ref 0–51)

## 2020-12-29 LAB — URINE CULTURE HOLD SAMPLE

## 2020-12-29 LAB — SAMPLES BEING HELD

## 2020-12-29 LAB — NT-PRO BNP: NT pro-BNP: 27 pg/mL (ref <125)

## 2020-12-29 LAB — TSH 3RD GENERATION
TSH: 4.61 u[IU]/mL — ABNORMAL HIGH (ref 0.36–3.74)
TSH: 4.61 u[IU]/mL — ABNORMAL HIGH (ref 0.36–3.74)

## 2020-12-29 LAB — LACTIC ACID
Lactic Acid: 2.6 MMOL/L (ref 0.4–2.0)
Lactic acid: 2.6 MMOL/L — CR (ref 0.4–2.0)

## 2020-12-29 LAB — HCG URINE, QL
HCG urine, QL: NEGATIVE
Pregnancy Test(Urn): NEGATIVE

## 2020-12-29 LAB — CORTISOL
Cortisol, random: 27 ug/dL
Cortisol: 27 ug/dL

## 2020-12-29 LAB — CBC WITH AUTO DIFFERENTIAL
Basophils %: 1 % (ref 0–1)
Basophils Absolute: 0.1 10*3/uL (ref 0.0–0.1)
Eosinophils %: 4 % (ref 0–7)
Eosinophils Absolute: 0.4 10*3/uL (ref 0.0–0.4)
Granulocyte Absolute Count: 0 10*3/uL (ref 0.00–0.04)
Hematocrit: 42.4 % (ref 35.0–47.0)
Hemoglobin: 13.7 g/dL (ref 11.5–16.0)
Immature Granulocytes: 0 % (ref 0.0–0.5)
Lymphocytes %: 23 % (ref 12–49)
Lymphocytes Absolute: 2 10*3/uL (ref 0.8–3.5)
MCH: 31.4 PG (ref 26.0–34.0)
MCHC: 32.3 g/dL (ref 30.0–36.5)
MCV: 97.2 FL (ref 80.0–99.0)
MPV: 9.8 FL (ref 8.9–12.9)
Monocytes %: 7 % (ref 5–13)
Monocytes Absolute: 0.6 10*3/uL (ref 0.0–1.0)
NRBC Absolute: 0 10*3/uL (ref 0.00–0.01)
Neutrophils %: 65 % (ref 32–75)
Neutrophils Absolute: 5.7 10*3/uL (ref 1.8–8.0)
Nucleated RBCs: 0 PER 100 WBC
Platelets: 328 10*3/uL (ref 150–400)
RBC: 4.36 M/uL (ref 3.80–5.20)
RDW: 13.2 % (ref 11.5–14.5)
WBC: 8.9 10*3/uL (ref 3.6–11.0)

## 2020-12-29 LAB — COMPREHENSIVE METABOLIC PANEL
ALT: 32 U/L (ref 12–78)
AST: 36 U/L (ref 15–37)
Albumin/Globulin Ratio: 1 — ABNORMAL LOW (ref 1.1–2.2)
Albumin: 3.2 g/dL — ABNORMAL LOW (ref 3.5–5.0)
Alkaline Phosphatase: 44 U/L — ABNORMAL LOW (ref 45–117)
Anion Gap: 10 mmol/L (ref 5–15)
BUN: 17 MG/DL (ref 6–20)
Bun/Cre Ratio: 16 (ref 12–20)
CO2: 26 mmol/L (ref 21–32)
Calcium: 8.3 MG/DL — ABNORMAL LOW (ref 8.5–10.1)
Chloride: 103 mmol/L (ref 97–108)
Creatinine: 1.08 MG/DL — ABNORMAL HIGH (ref 0.55–1.02)
ESTIMATED GLOMERULAR FILTRATION RATE: >60 mL/min/{1.73_m2} (ref >60)
Globulin: 3.1 g/dL (ref 2.0–4.0)
Glucose: 120 mg/dL — ABNORMAL HIGH (ref 65–100)
Potassium: 3.7 mmol/L (ref 3.5–5.1)
Sodium: 139 mmol/L (ref 136–145)
Total Bilirubin: 0.3 MG/DL (ref 0.2–1.0)
Total Protein: 6.3 g/dL — ABNORMAL LOW (ref 6.4–8.2)

## 2020-12-29 LAB — EKG 12-LEAD
Atrial Rate: 86 {beats}/min
Diagnosis: NORMAL
P Axis: 65 degrees
P-R Interval: 180 ms
Q-T Interval: 382 ms
QRS Duration: 88 ms
QTc Calculation (Bazett): 457 ms
R Axis: 46 degrees
T Axis: 19 degrees
Ventricular Rate: 86 {beats}/min

## 2020-12-29 LAB — TROPONIN, HIGH SENSITIVITY: Troponin, High Sensitivity: 5 ng/L (ref 0–51)

## 2020-12-29 LAB — BASIC METABOLIC PANEL
Anion Gap: 6 mmol/L (ref 5–15)
BUN: 12 MG/DL (ref 6–20)
Bun/Cre Ratio: 15 (ref 12–20)
CO2: 27 mmol/L (ref 21–32)
Calcium: 8.2 MG/DL — ABNORMAL LOW (ref 8.5–10.1)
Chloride: 107 mmol/L (ref 97–108)
Creatinine: 0.81 MG/DL (ref 0.55–1.02)
ESTIMATED GLOMERULAR FILTRATION RATE: >60 mL/min/{1.73_m2} (ref >60)
Glucose: 117 mg/dL — ABNORMAL HIGH (ref 65–100)
Potassium: 4.2 mmol/L (ref 3.5–5.1)
Sodium: 140 mmol/L (ref 136–145)

## 2020-12-29 LAB — PROBNP, N-TERMINAL: BNP: 27 PG/ML (ref <125)

## 2020-12-29 MED ORDER — SODIUM CHLORIDE 0.9% BOLUS IV
0.9 % | Freq: Once | INTRAVENOUS | Status: AC
Start: 2020-12-29 — End: 2020-12-29
  Administered 2020-12-29: 04:00:00 via INTRAVENOUS

## 2020-12-29 MED ORDER — HYDROCORTISONE SOD SUCCINATE (PF) 100 MG/2 ML SOLUTION FOR INJECTION
100 mg/2 mL | Freq: Three times a day (TID) | INTRAMUSCULAR | Status: DC
Start: 2020-12-29 — End: 2020-12-29

## 2020-12-29 MED ORDER — BISACODYL 10 MG RECTAL SUPPOSITORY
10 mg | Freq: Every day | RECTAL | Status: DC | PRN
Start: 2020-12-29 — End: 2020-12-29

## 2020-12-29 MED ORDER — VANCOMYCIN IN 0.9 % SODIUM CHLORIDE 2 GRAM/500 ML IV
2 gram/500 mL | Freq: Once | INTRAVENOUS | Status: DC
Start: 2020-12-29 — End: 2020-12-29
  Administered 2020-12-29: 05:00:00 via INTRAVENOUS

## 2020-12-29 MED ORDER — NOREPINEPHRINE 8 MG/250 ML (32 MCG/ML) D5W INFUSION
8 mg/250 mL (32 mcg/mL) | INTRAVENOUS | Status: DC
Start: 2020-12-29 — End: 2020-12-29
  Administered 2020-12-29: 05:00:00 via INTRAVENOUS

## 2020-12-29 MED ORDER — NITROFURANTOIN (25% MACROCRYSTAL FORM) 100 MG CAP
100 mg | ORAL_CAPSULE | Freq: Two times a day (BID) | ORAL | 0 refills | Status: AC
Start: 2020-12-29 — End: 2021-01-03

## 2020-12-29 MED ORDER — ONDANSETRON (PF) 4 MG/2 ML INJECTION
4 mg/2 mL | Freq: Four times a day (QID) | INTRAMUSCULAR | Status: DC | PRN
Start: 2020-12-29 — End: 2020-12-29

## 2020-12-29 MED ORDER — SODIUM CHLORIDE 0.9 % IJ SYRG
INTRAMUSCULAR | Status: DC | PRN
Start: 2020-12-29 — End: 2020-12-29

## 2020-12-29 MED ORDER — SODIUM CHLORIDE 0.9 % IJ SYRG
Freq: Three times a day (TID) | INTRAMUSCULAR | Status: DC
Start: 2020-12-29 — End: 2020-12-29
  Administered 2020-12-29: 12:00:00 via INTRAVENOUS

## 2020-12-29 MED ORDER — HYDROCORTISONE SOD SUCCINATE (PF) 100 MG/2 ML SOLUTION FOR INJECTION
100 mg/2 mL | Freq: Once | INTRAMUSCULAR | Status: AC
Start: 2020-12-29 — End: 2020-12-29
  Administered 2020-12-29: 05:00:00 via INTRAVENOUS

## 2020-12-29 MED ORDER — SODIUM CHLORIDE 0.9 % IV
INTRAVENOUS | Status: DC | PRN
Start: 2020-12-29 — End: 2020-12-29

## 2020-12-29 MED ORDER — ACETAMINOPHEN 325 MG TABLET
325 mg | Freq: Four times a day (QID) | ORAL | Status: DC | PRN
Start: 2020-12-29 — End: 2020-12-29

## 2020-12-29 MED ORDER — SODIUM CHLORIDE 0.9 % IV PIGGY BACK
4.5 gram | Freq: Once | INTRAVENOUS | Status: DC
Start: 2020-12-29 — End: 2020-12-29
  Administered 2020-12-29: 05:00:00 via INTRAVENOUS

## 2020-12-29 MED ORDER — PROMETHAZINE 25 MG TAB
25 mg | Freq: Four times a day (QID) | ORAL | Status: DC | PRN
Start: 2020-12-29 — End: 2020-12-29

## 2020-12-29 MED ORDER — ACETAMINOPHEN 650 MG RECTAL SUPPOSITORY
650 mg | Freq: Four times a day (QID) | RECTAL | Status: DC | PRN
Start: 2020-12-29 — End: 2020-12-29

## 2020-12-29 MED ORDER — SODIUM CHLORIDE 0.9 % IV
INTRAVENOUS | Status: DC
Start: 2020-12-29 — End: 2020-12-29
  Administered 2020-12-29: 08:00:00 via INTRAVENOUS

## 2020-12-29 MED FILL — BD POSIFLUSH NORMAL SALINE 0.9 % INJECTION SYRINGE: INTRAMUSCULAR | Qty: 40

## 2020-12-29 MED FILL — SOLU-CORTEF ACT-O-VIAL (PF) 100 MG/2 ML SOLUTION FOR INJECTION: 100 mg/2 mL | INTRAMUSCULAR | Qty: 2

## 2020-12-29 MED FILL — SODIUM CHLORIDE 0.9 % IV: INTRAVENOUS | Qty: 250

## 2020-12-29 MED FILL — SODIUM CHLORIDE 0.9 % IV: INTRAVENOUS | Qty: 1000

## 2020-12-29 MED FILL — NOREPINEPHRINE 8 MG/250 ML (32 MCG/ML) D5W INFUSION: 8 mg/250 mL (32 mcg/mL) | INTRAVENOUS | Qty: 250

## 2020-12-29 MED FILL — SODIUM CHLORIDE 0.9 % IV: INTRAVENOUS | Qty: 50

## 2020-12-29 NOTE — Progress Notes (Signed)
 PHYSICAL THERAPY EVALUATION/DISCHARGE  Patient: Maria Edwards (40 y.o. female)  Date: 12/29/2020  Primary Diagnosis: Shock circulatory (HCC) [R57.9]       Precautions: Universal         ASSESSMENT  Patient's preferred pronouns are female.  Parents present.  Clemens one year ago with TBI and dysarthria  Based on the objective data described below, the patient presents with syncopal episode at home, unwitnessed, lowered to the floor.  Patient was sitting at the kitchen table, felt dizzy.  The patient states she got up to sit on the floor so she would not fall from the chair.  Mom then lowered her to the floor.      Performed orthostatics - see flow sheet for details.  After standing 2 mins patient reports seeing spots and floaters, dissipates within 30 secs.   No drop in BP noted, HR increases 10 bpm with standing, dyastolic is high (pt reports this is normal for her)    Gait and balance WNL    Functional Outcome Measure:  The patient scored 28 out of 28 on the Tinetti outcome measure which is indicative of low fall risk.      Other factors to consider for discharge: lives with family     Further skilled acute physical therapy is not indicated at this time.     PLAN :  Recommendation for discharge: (in order for the patient to meet his/her long term goals)  No skilled physical therapy/ follow up rehabilitation needs identified at this time.    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     Traumatic brain injury      Past Surgical History:   Procedure Laterality Date    HX WISDOM TEETH EXTRACTION         Prior level of function: independent, no assistive  device, no recent falls  Personal factors and/or comorbidities impacting plan of care:          EXAMINATION/PRESENTATION/DECISION MAKING:   Critical Behavior:              Hearing:WNL     Skin:  visible skin appears intact  Edema: none noted  Range Of Motion:         WNL                 Strength:           WNL              Tone & Sensation:                WNL                  Coordination:   WNL  Vision:    WNL  Functional Mobility:  Bed Mobility:      Ind        Transfers:            Ind                 Balance:  Ambulation/Gait Training:            Ind 200  ft without an assistive device                         Functional Measure:  Tinetti test:    Sitting Balance: 1  Arises: 2  Attempts to Rise: 2  Immediate Standing Balance: 2  Standing Balance: 2  Nudged: 2  Eyes Closed: 1  Turn 360 Degrees - Continuous/Discontinuous: 1  Turn 360 Degrees - Steady/Unsteady: 1  Sitting Down: 2  Balance Score: 16 Balance total score  Indication of Gait: 1  R Step Length/Height: 1  L Step Length/Height: 1  R Foot Clearance: 1  L Foot Clearance: 1  Step Symmetry: 1  Step Continuity: 1  Path: 2  Trunk: 2  Walking Time: 1  Gait Score: 12 Gait total score  Total Score: 28/28 Overall total score         Tinetti Tool Score Risk of Falls  <19 = High Fall Risk  19-24 = Moderate Fall Risk  25-28 = Low Fall Risk  Tinetti ME. Performance-Oriented Assessment of Mobility Problems in Elderly Patients. JAGS 1986; [id number]. (Scoring Description: PT Bulletin Feb. 10, 1993)    Older adults: Gaines et al, 2009; n = 1000 [place] elderly evaluated with ABC, POMA, ADL, and IADL)   Mean POMA score for males aged 65-79 years = 26.21(3.40)   Mean POMA score for females age 4-79 years = 25.16(4.30)   Mean POMA score for males over 80 years = 23.29(6.02)   Mean POMA score for females over 80 years = 17.20(8.32)           Physical Therapy Evaluation Charge Determination   History Examination Presentation Decision-Making   LOW Complexity : Zero  comorbidities / personal factors that will impact the outcome / POC LOW Complexity : 1-2 Standardized tests and measures addressing body structure, function, activity limitation and / or participation in recreation  LOW Complexity : Stable, uncomplicated  LOW Complexity : FOTO score of 75-100      Based on the above components, the patient evaluation is determined to be of the following complexity level: LOW     Pain Rating:  General pain old age reported    Activity Tolerance:   Good      After treatment patient left in no apparent distress:   Call bell within reach, Caregiver / family present, and sitting at side of bed    COMMUNICATION/EDUCATION:   The patient's plan of care was discussed with: Registered nurse.     Fall prevention education was provided and the patient/caregiver indicated understanding., Patient/family have participated as able in goal setting and plan of care., and Patient/family agree to work toward stated goals and plan of care.    Thank you for this referral.  Olam Meier, PT

## 2020-12-29 NOTE — Discharge Summary (Signed)
Discharge Summary by Glenetta Hew, MD at 12/29/20 [phone]                Author: Glenetta Hew, MD  Service: Hospitalist  Author Type: Physician       Filed: 12/29/20 1821  Date of Service: 12/29/20 0941  Status: Signed          Editor: Glenetta Hew, MD (Physician)                      Hospitalist Discharge Summary        Patient ID:   Maria Edwards   [id number]   40 y.o.   1980/07/10      Admit date: 12/28/2020      Discharge date and time: 12/29/2020      Admission Diagnoses: Shock circulatory 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  The patient has been in a normal state of health when he developed sudden onset of  lightheadedness, head pressure, and syncopal episode.  Denied chest pain, SOB, fevers, chills, nausea, vomiting, diarrhea, or prior episodes.  No recent travels or sick contacts.  Takes multiple blood pressure medications.  In the ED, SBP was in the  70s to 80s.  Lactate was 2.6.  He was admitted for further evaluation and treatment of the following:      #Syncope: This was multifactorial with volume depletion, UTI, alcohol, marijuana and use of home anti-hypertensives contributing. Pt responded well to IVF. Does not need echo or MRI      #UTI: Had been on TMP-SMX, but UA still positive so discharged on macrobid. No c/f bacteremia      #HTN: Advised hold home meds today and can resume tomorrow          PCP: Rosann Auerbach, MD       Consults: None      Condition of patient at discharge: good and improved      Discharge Exam:      Physical Exam:      Gen: Well-developed, well-nourished, in no acute distress   HEENT:  Pink conjunctivae, PERRL, hearing intact to voice, moist mucous membranes    Neck: Supple, without masses, thyroid non-tender   Resp: No accessory muscle use, clear breath sounds without wheezes rales or rhonchi   Card: No murmurs, normal S1, S2 without thrills, bruits or peripheral edema   Abd:  Soft, non-tender, non-distended, normoactive bowel sounds are present, no palpable organomegaly and no detectable hernias   Lymph:  No cervical or inguinal adenopathy   Musc: No cyanosis or clubbing   Skin: No rashes or ulcers, skin turgor is good   Neuro:  Cranial nerves are grossly intact, no focal motor weakness, follows commands appropriately   Psych:  Good insight, oriented to person, place and time, alert               Disposition: home      Patient Instructions:      Current Discharge Medication List  START taking these medications          Details        nitrofurantoin, macrocrystal-monohydrate, (Macrobid) 100 mg capsule  Take 1 Capsule by mouth two (2) times a day for 5 days.   Qty: 10 Capsule, Refills: 0                        CONTINUE these medications which have NOT CHANGED          Details        LISINOPRIL PO  Take  by mouth.               CARVEDILOL PO  Take  by mouth.               amlodipine besylate (AMLODIPINE PO)  Take  by mouth.               cariprazine HCl (VRAYLAR PO)  Take  by mouth.               duloxetine HCl (DULOXETINE PO)  Take  by mouth.               celecoxib (CELEBREX PO)  Take  by mouth.               TESTOSTERONE BU  by Buccal route. Weekly injection               HYDROcodone-acetaminophen (NORCO) 7.5-325 mg per tablet  Take 1 Tab by mouth every six (6) hours as needed for Pain. Max Daily Amount: 4 Tabs. Do not drive for 6 hours after taking, may impair ability to drive   Qty: 20 Tab, Refills: 0               ibuprofen (MOTRIN) 600 mg tablet  Take 1 Tab by mouth every six (6) hours as needed for Pain.   Qty: 20 Tab, Refills: 0                      Activity: Activity as tolerated   Diet: Regular Diet   Wound Care: None needed      Follow-up with  Rosann Auerbach, MD in 1 week.   Follow-up tests/labs None      Approximate time spent in patient care on day of discharge: 20 min      Signed:   Glenetta Hew, MD   12/29/2020   9:41 AM

## 2020-12-29 NOTE — ED Notes (Signed)
Verbal shift change report given to Venita Sheffield RN (oncoming nurse) by Elvina Mattes (offgoing nurse). Report included the following information SBAR, Kardex, ED Summary, MAR, and Recent Results.

## 2020-12-29 NOTE — Progress Notes (Signed)
[facility]            12/29/2020      RE: [name] (Date of Birth: 1980/10/14)    To Whom it May Concern:    This is to certify that Maria Edwards was admitted to [facility] from 12/28/2020 to 12/29/2020. Maria Edwards is advised to rest for a few days and may return to work on 10/30 or 10/31 with no new restrictions.      Please feel free to contact my office if you have any questions or concerns.  Thank you for your assistance in this matter.        Glenetta Hew, MD  [phone] office

## 2020-12-29 NOTE — Progress Notes (Signed)
Physician Progress Note      PATIENT:               Maria Edwards, Maria Edwards  CSN #:                  [id number]  DOB:                       Mar 26, 1980  ADMIT DATE:       12/28/2020 10:58 PM  DISCH DATE:        12/29/2020 11:56 AM  RESPONDING  PROVIDER #:        Glenetta Hew MD          QUERY TEXTPeri Jefferson Afternoon    This patient admitted on 12/28/2020-12/30/2020.    The H&P notes "Shock". the patient noted with systolic bp in the 70-80s and required IVF and IV hydrocortisone for improvement.    The diagnosis of Shock was dropped from the discharge summary and if possible, please needs to be clarified.    If possible, please document in the progress notes and discharge summary if Shock POA was:    The medical record reflects the following:  Risk Factors: Multifactorial, Volume depletion, UTI, Alcohol, marijuana and antihypertensives  Clinical Indicators: Presented with sudden onset of lightheadedness, head pressure and syncopal episode. Alcohol level @ 62 Takes mx blood pressure meds. Systolic BP in the 70-80s, requiring IVF boluses, and IV hydrocortisone  Treatment: Mx IVF boluses, IV hydrocortisone, close monitoring of BP, Lactic acid assessed, Hypertensive meds were placed on hold    Thank you  Leroy Kennedy, BSN,Rn, CPHQ, CCDS, SMART  Options provided:  -- Shock POA was confirmed after study  -- Shock POA ruled out after study. Hypotension, multifactorial causes but without shock  -- Other - I will add my own diagnosis  -- Disagree - Not applicable / Not valid  -- Disagree - Clinically unable to determine / Unknown  -- Refer to Clinical Documentation Reviewer    PROVIDER RESPONSE TEXT:    Shock POA ruled out after study. Hypotension due to multifactorial causes but without shock.    Query created by: Leroy Kennedy on 01/02/2021 1:47 PM      Electronically signed by:  Glenetta Hew MD 01/03/2021 6:49 AM

## 2020-12-29 NOTE — H&P (Signed)
[place] [facility]  [address] 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  The patient has been in a normal state of health when he developed sudden onset of lightheadedness, head pressure, and syncopal episode.  Denied chest pain, SOB, fevers, chills, nausea, vomiting, diarrhea, or prior episodes.  No recent travels or sick contacts.  Takes multiple blood pressure medications.  In the ED, SBP was in the 70s to 80s.  Lactate was 2.6.  Patient's symptoms improved with IVF and IV hydrocortisone.      Allergies   Allergen Reactions    Ceclor [Cefaclor] Rash       Prior to Admission medications    Medication Sig Start Date End Date Taking? Authorizing Provider   LISINOPRIL PO Take  by mouth.   Yes Other, Phys, MD   CARVEDILOL PO Take  by mouth.   Yes Other, Phys, MD   amlodipine besylate (AMLODIPINE PO) Take  by mouth.   Yes Other, Phys, MD   cariprazine HCl (VRAYLAR PO) Take  by mouth.   Yes Other, Phys, MD   duloxetine HCl (DULOXETINE PO) Take  by mouth.   Yes Other, Phys, MD   celecoxib (CELEBREX PO) Take  by mouth.   Yes Other, Phys, MD   TESTOSTERONE BU by Buccal route. Weekly injection   Yes Other, Phys, MD   HYDROcodone-acetaminophen (NORCO) 7.5-325 mg per tablet Take 1 Tab by mouth every six (6) hours as needed for Pain. Max Daily Amount: 4 Tabs. Shaheim Mahar not drive for 6 hours after taking, may impair ability to drive 7/71/16   Jefm Bryant, MD   ibuprofen (MOTRIN) 600 mg tablet Take 1 Tab by mouth every six (6) hours as needed for Pain. 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     Traumatic brain injury         Past Surgical History:   Procedure Laterality Date    HX WISDOM TEETH EXTRACTION         Social History     Tobacco Use    Smoking status: Never    Smokeless tobacco: Never   Substance Use Topics    Alcohol use: Yes     Alcohol/week: 6.0 standard drinks     Types: 6 Glasses of wine per week        Family History   Problem Relation Age of Onset    Hypertension Father         Review of Systems:  (bold if positive, if negative)    Gen:  Eyes:  ENT:  CVS:  dizziness, syncope, Pulm:  GI:  GU:  MS:  Skin:  Psych:  Endo:  Hem:  Renal:  Neuro:          Objective:  VITALS:    Vital signs reviewed; most recent are:    Visit Vitals  BP 94/63   Pulse 89   Resp 19   SpO2 99%     SpO2 Readings from Last 6 Encounters:   12/29/20 99%   08/30/16 99%   09/17/13 100%   05/17/12 100%   11/01/11 97%   08/14/10 97%        No intake or output data in the 24 hours ending 12/29/20 0201     Exam:     Physical Exam:    Gen:  Well-developed, well-nourished, in no acute distress  HEENT:  Pink conjunctivae, PERRL, hearing intact to voice, moist mucous membranes  Neck:  Supple, without masses, thyroid non-tender  Resp:  No accessory muscle use, clear breath sounds without wheezes rales or rhonchi  Card:  No murmurs, normal S1, S2 without thrills, bruits or peripheral edema  Abd:  Soft, non-tender, non-distended, normoactive bowel sounds are present  Lymph:  No cervical adenopathy  Musc:  No cyanosis or clubbing  Skin:  No rashes  Neuro:  Cranial nerves 3-12 are grossly intact, grip strength is 5/5 bilaterally, dorsi / plantarflexion strength is 5/5 bilaterally, follows commands appropriately  Psych:  Alert with good insight.  Oriented to person, place, and time    Labs:    Recent Labs     12/28/20  2327   WBC 8.9   HGB 13.7   HCT 42.4   PLT 328     Recent Labs     12/28/20  2327   NA 139   K 3.7   CL 103   CO2 26   GLU 120*    BUN 17   CREA 1.08*   CA 8.3*   MG 2.0   ALB 3.2*   TBILI 0.3   ALT 32     Lab Results   Component Value Date/Time    Glucose (POC) 87 09/17/2013 09:21 PM     No results for input(s): PH, PCO2, PO2, HCO3, FIO2 in the last 72 hours.  No results for input(s): INR, INREXT in the last 72 hours.    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  Could be related to hypovolemia vs HTN meds vs adrenal insuff.  No evidence of sepsis.  Will check TSH, cortisol.  Cont IVF, IV hydrocortisone.  Monitor closely    2) Syncope and collapse: due to hypotension.  No evidence of CVAs or seizures.  Will obtain head CT and MRI, echo.  Monitor on Tele    3) Lactic acidosis: due to hypotension.  Not septic.  Will cont IVF, monitor lactate    4) HTN: hold all meds    5) Hx of TBI: occurred 1 year ago after a fall.  Main deficit is mild dysarthria    6) Transgender: currently on testosterone     Risk of deterioration: high      Total time spent with patient care: 25 Minutes **I personally saw and examined the patient during this time period**                 Care Plan discussed with: Patient, nursing     Discussed:  Care  Plan    Prophylaxis:  SCD's    Probable Disposition:  Home w/Family           ___________________________________________________    Attending Physician: Dayna Barker, MD

## 2020-12-29 NOTE — Progress Notes (Signed)
Occupational Therapy: OT order received, chart reviewed, consulted with team members. Patient is up ad lib, completing ADL tasks with independence, and with no lasting symptoms . OT services not indicated  Maria Edwards, OTR/L

## 2020-12-29 NOTE — ED Notes (Signed)
Patient's fiancee leaving for the night, [place]onna [place] [phone]

## 2021-01-05 LAB — CULTURE, BLOOD, PAIRED
Culture result:: NO GROWTH
Culture: NO GROWTH

## 2021-02-16 NOTE — Telephone Encounter (Signed)
Summary: NEW PT EVAL    Formatting of this note might be different from the original.  Aurther Loft,    I received one office note and medical summary and per the note the patient was there to Establish Care. Will you please check beacon for labs and let me know when to schedule.    Thx   Electronically signed by Gerri Spore at 02/16/2021  6:31 AM MST

## 2021-02-19 NOTE — Telephone Encounter (Signed)
Formatting of this note might be different from the original.  Thx, sending a fax correspondence to the referring provider.   Electronically signed by Gerri Spore at 02/19/2021  7:13 AM MST

## 2021-08-07 ENCOUNTER — Emergency Department: Admit: 2021-08-07

## 2021-08-07 ENCOUNTER — Inpatient Hospital Stay
Admit: 2021-08-07 | Discharge: 2021-08-07 | Disposition: A | Discharge status: Home / Self Care | Attending: Emergency Medicine

## 2021-08-07 DIAGNOSIS — R202 Paresthesia of skin: Secondary | ICD-10-CM

## 2021-08-07 LAB — CBC WITH AUTO DIFFERENTIAL
Absolute Immature Granulocyte: 0 10*3/uL (ref 0.00–0.04)
Basophils %: 1 % (ref 0–1)
Basophils Absolute: 0.1 10*3/uL (ref 0.0–0.1)
Eosinophils %: 3 % (ref 0–7)
Eosinophils Absolute: 0.3 10*3/uL (ref 0.0–0.4)
Hematocrit: 50.6 % — ABNORMAL HIGH (ref 35.0–47.0)
Hemoglobin: 16.9 g/dL — ABNORMAL HIGH (ref 11.5–16.0)
Immature Granulocytes: 0 % (ref 0.0–0.5)
Lymphocytes %: 27 % (ref 12–49)
Lymphocytes Absolute: 2.4 10*3/uL (ref 0.8–3.5)
MCH: 31.6 PG (ref 26.0–34.0)
MCHC: 33.4 g/dL (ref 30.0–36.5)
MCV: 94.8 FL (ref 80.0–99.0)
MPV: 9.9 FL (ref 8.9–12.9)
Monocytes %: 6 % (ref 5–13)
Monocytes Absolute: 0.5 10*3/uL (ref 0.0–1.0)
Neutrophils %: 63 % (ref 32–75)
Neutrophils Absolute: 5.6 10*3/uL (ref 1.8–8.0)
Nucleated RBCs: 0 PER 100 WBC
Platelets: 314 10*3/uL (ref 150–400)
RBC: 5.34 M/uL — ABNORMAL HIGH (ref 3.80–5.20)
RDW: 13 % (ref 11.5–14.5)
WBC: 8.9 10*3/uL (ref 3.6–11.0)
nRBC: 0 10*3/uL (ref 0.00–0.01)

## 2021-08-07 LAB — COMPREHENSIVE METABOLIC PANEL
ALT: 50 U/L (ref 12–78)
AST: 55 U/L — ABNORMAL HIGH (ref 15–37)
Albumin/Globulin Ratio: 1.1 (ref 1.1–2.2)
Albumin: 3.9 g/dL (ref 3.5–5.0)
Alk Phosphatase: 55 U/L (ref 45–117)
Anion Gap: 8 mmol/L (ref 5–15)
BUN: 10 MG/DL (ref 6–20)
Bun/Cre Ratio: 11 — ABNORMAL LOW (ref 12–20)
CO2: 27 mmol/L (ref 21–32)
Calcium: 8.7 MG/DL (ref 8.5–10.1)
Chloride: 100 mmol/L (ref 97–108)
Creatinine: 0.95 MG/DL (ref 0.55–1.02)
Est, Glom Filt Rate: >60 mL/min/{1.73_m2} (ref >60)
Globulin: 3.5 g/dL (ref 2.0–4.0)
Glucose: 90 mg/dL (ref 65–100)
Potassium: 3.9 mmol/L (ref 3.5–5.1)
Sodium: 135 mmol/L — ABNORMAL LOW (ref 136–145)
Total Bilirubin: 1.1 MG/DL — ABNORMAL HIGH (ref 0.2–1.0)
Total Protein: 7.4 g/dL (ref 6.4–8.2)

## 2021-08-07 LAB — URINALYSIS WITH MICROSCOPIC
BACTERIA, URINE: NEGATIVE /hpf
Bilirubin Urine: NEGATIVE
Blood, Urine: NEGATIVE
Glucose, UA: NEGATIVE mg/dL
Ketones, Urine: 40 mg/dL — AB
Nitrite, Urine: NEGATIVE
Protein, UA: NEGATIVE mg/dL
Specific Gravity, UA: 1.017 (ref 1.003–1.030)
Urobilinogen, Urine: 0.2 EU/dL (ref 0.2–1.0)
pH, Urine: 6.5 (ref 5.0–8.0)

## 2021-08-07 LAB — POC LACTIC ACID: POC Lactic Acid: 1.41 mmol/L (ref 0.40–2.00)

## 2021-08-07 LAB — TSH: TSH, 3RD GENERATION: 5.78 u[IU]/mL — ABNORMAL HIGH (ref 0.36–3.74)

## 2021-08-07 LAB — EXTRA TUBES HOLD

## 2021-08-07 LAB — ETHANOL: Ethanol Lvl: <10 MG/DL (ref <10)

## 2021-08-07 LAB — URINE CULTURE HOLD SAMPLE

## 2021-08-07 LAB — POCT GLUCOSE: POC Glucose: 75 mg/dL (ref 65–117)

## 2021-08-07 NOTE — Discharge Instructions (Signed)
Thank you for coming to the Emergency Department.  CAT scan of your head and neck were normal.  Your labs were all normal.  I have included the number of an endocrinologist and primary care physician please schedule appointments.  Please increase your free water.

## 2021-08-07 NOTE — ED Provider Notes (Signed)
[facility] EMERGENCY DEPT  EMERGENCY DEPARTMENT ENCOUNTER      Pt Name: Maria Edwards  MRN: [id number]  [place] 03/23/1980  Date of evaluation: 08/07/2021  Provider: Oda Cogan, APRN - NP    [place]       Chief Complaint   Patient presents with    Numbness         HISTORY OF PRESENT ILLNESS   (Location/Symptom, Timing/Onset, Context/Setting, Quality, Duration, Modifying Factors, Severity)  Note limiting factors.   41 year old female that is taking testosterone that she is prescribed online for transitioning.  Today she describes heaviness in her hands and her arms.  States she feels her dexterity in her hands have decreased.  She is alert and oriented, denies chest pain, denies shortness of breath abdominal pain denies difficulty with her bowel movements or when she urinates.  Patient states she had a head bleed 2 years ago after a fall down the stairs.  She denies any other traumatic events.          Review of External Medical Records:     Nursing Notes were reviewed.    REVIEW OF SYSTEMS    (2-9 systems for level 4, 10 or more for level 5)     Review of Systems   Constitutional:  Positive for fatigue (Heaviness in her arms and hands). Negative for activity change, chills and fever.   HENT:  Negative for congestion, drooling, facial swelling, hearing loss, nosebleeds, tinnitus and voice change.    Eyes:  Negative for discharge and itching.   Respiratory:  Negative for apnea, cough, choking, chest tightness and wheezing.    Cardiovascular:  Negative for chest pain.   Gastrointestinal:  Negative for abdominal distention, abdominal pain, blood in stool, diarrhea, rectal pain and vomiting.   Endocrine: Negative for cold intolerance, polydipsia and polyuria.   Genitourinary:  Negative for decreased urine volume, difficulty urinating, flank pain, genital sores and hematuria.   Musculoskeletal:  Positive for myalgias (Heaviness in her arms and hands). Negative for arthralgias, back pain, joint swelling and neck  pain.   Skin:  Negative for color change and rash.   Allergic/Immunologic: Negative for immunocompromised state.   Neurological:  Negative for dizziness, seizures, syncope, weakness, numbness and headaches.   Psychiatric/Behavioral:  Negative for agitation, confusion, dysphoric mood, sleep disturbance and suicidal ideas. The patient is not nervous/anxious and is not hyperactive.      Except as noted above the remainder of the review of systems was reviewed and negative.       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    IBUPROFEN (ADVIL;MOTRIN) 600 MG TABLET    Take 600 mg by mouth every 6 hours as needed    LISINOPRIL PO    Take by mouth    TESTOSTERONE BU    Place inside cheek  ALLERGIES     Cefaclor    FAMILY HISTORY       Family History   Problem Relation Age of Onset    Hypertension Father           SOCIAL HISTORY       Social History     Socioeconomic History    Marital status: Divorced   Tobacco Use    Smoking status: Never    Smokeless tobacco: Never   Substance and Sexual Activity    Alcohol use: Yes     Alcohol/week: 6.0 standard drinks    Drug use: No           PHYSICAL EXAM    (up to 7 for level 4, 8 or more for level 5)     ED Triage Vitals [08/07/21 0828]   BP Temp Temp Source Pulse Respirations SpO2 Height Weight - Scale   (!) 150/86 97.7 F (36.5 C) Oral 82 16 99 % '4\' 11"'  (1.499 m) 120 lb (54.4 kg)       Body mass index is 24.24 kg/m.    Physical Exam  Vitals and nursing note reviewed.   Constitutional:       Appearance: Normal appearance.   HENT:      Head: Normocephalic and  atraumatic.      Right Ear: Tympanic membrane and ear canal normal.      Left Ear: Tympanic membrane and ear canal normal.      Nose: Nose normal.      Mouth/Throat:      Mouth: Mucous membranes are moist.   Eyes:      Extraocular Movements: Extraocular movements intact.      Pupils: Pupils are equal, round, and reactive to light.   Cardiovascular:      Rate and Rhythm: Normal rate.      Pulses: Normal pulses.      Heart sounds: Normal heart sounds.   Pulmonary:      Effort: Pulmonary effort is normal.      Breath sounds: Normal breath sounds.   Abdominal:      General: Abdomen is flat. Bowel sounds are normal.      Palpations: Abdomen is soft.   Musculoskeletal:         General: Normal range of motion.      Cervical back: Normal range of motion and neck supple.   Skin:     General: Skin is warm and dry.   Neurological:      General: No focal deficit present.      Mental Status: She is alert and oriented to person, place, and time. Mental status is at baseline.   Psychiatric:         Mood and Affect: Mood normal.         Behavior: Behavior normal.       DIAGNOSTIC RESULTS     EKG: All EKG's are interpreted by the Emergency Department Physician who either signs or Co-signs this chart in the absence of a cardiologist.        RADIOLOGY:   Non-plain film images such as CT, Ultrasound and MRI are read by the radiologist. Plain radiographic images are visualized and preliminarily interpreted by the emergency physician with the below findings:        Interpretation per the Radiologist below, if available at the time of this note:    XR CHEST PORTABLE    (Results Pending)   CT HEAD WO CONTRAST    (Results  Pending)   CT CERVICAL SPINE WO CONTRAST    (Results Pending)        LABS:  Labs Reviewed   EXTRA TUBES HOLD   CBC WITH AUTO DIFFERENTIAL   COMPREHENSIVE METABOLIC PANEL   LACTIC ACID   LACTIC ACID   TSH   URINALYSIS WITH MICROSCOPIC   ETHANOL   POCT GLUCOSE       All other labs were within normal range or not returned as  of this dictation.    EMERGENCY DEPARTMENT COURSE and DIFFERENTIAL DIAGNOSIS/MDM:   Vitals:    Vitals:    08/07/21 0828   BP: (!) 150/86   Pulse: 82   Resp: 16   Temp: 97.7 F (36.5 C)   TempSrc: Oral   SpO2: 99%   Weight: 54.4 kg (120 lb)   Height: 1.499 m ('4\' 11"' )           Medical Decision Making  41 year old female who takes lisinopril amlodipine and testosterone identifying as female.  She does have facial hair.  Complains of Ortho neuralgias in her arms.  CT neck and head were completed these were both negative to rule out any masses or cervical involvement.  Labs are stable, EKG was normal.  Patient is obtaining testosterone online this could be a side effect.  Her labs are stable.  Based on results I will refer her to endocrinology and primary care for management of her hormonal levels.  Discussed in detail patient results.    Amount and/or Complexity of Data Reviewed  External Data Reviewed: notes.     Details: reviewed  Labs: ordered. Decision-making details documented in ED Course.  Radiology: ordered. Decision-making details documented in ED Course.  ECG/medicine tests: ordered. Decision-making details documented in ED Course.    Risk  Risk Details: The patient ultiumately did not warrant hospitalization nor any acute surgical intervention, I considered the need for both.  Also considered the need to obtain additional imaging and/or labs beyond what may have been ordered but no additional testing was indicated based on the patient's condition and results of any other testing that may have been performed.  Any medications given here and/or prescribed for discharge are documented within the other portions of the note.  After any medications or treatments that may have been provided here the patient was improved and symptoms had resolved or become tolerable or no medications or treatments were indicated based on the patient's condition.  The patient was deemed stable safe and appropriate for discharge to  home and is instructed to follow-up with his primary care doctor and return for any new or worsening symptoms.            REASSESSMENT            CONSULTS:  None    PROCEDURES:  Unless otherwise noted below, none     Procedures      FINAL IMPRESSION    No diagnosis found.      DISPOSITION/PLAN   DISPOSITION        PATIENT REFERRED TO:  No follow-up provider specified.    DISCHARGE MEDICATIONS:  New Prescriptions    No medications on file     LABORATORY TESTS:  Recent Results (from the past 12 hour(s))   Extra Tubes Hold    Collection Time: 08/07/21  8:38 AM   Result Value Ref Range    Specimen HOld 1dk grn,1blue,1red     Comment:        Add-on orders for  these samples will be processed based on acceptable specimen integrity and analyte stability, which may vary by analyte.   CBC with Auto Differential    Collection Time: 08/07/21  8:38 AM   Result Value Ref Range    WBC 8.9 3.6 - 11.0 K/uL    RBC 5.34 (H) 3.80 - 5.20 M/uL    Hemoglobin 16.9 (H) 11.5 - 16.0 g/dL    Hematocrit 50.6 (H) 35.0 - 47.0 %    MCV 94.8 80.0 - 99.0 FL    MCH 31.6 26.0 - 34.0 PG    MCHC 33.4 30.0 - 36.5 g/dL    RDW 13.0 11.5 - 14.5 %    Platelets 314 150 - 400 K/uL    MPV 9.9 8.9 - 12.9 FL    Nucleated RBCs 0.0 0 PER 100 WBC    nRBC 0.00 0.00 - 0.01 K/uL    Neutrophils % 63 32 - 75 %    Lymphocytes % 27 12 - 49 %    Monocytes % 6 5 - 13 %    Eosinophils % 3 0 - 7 %    Basophils % 1 0 - 1 %    Immature Granulocytes 0 0.0 - 0.5 %    Neutrophils Absolute 5.6 1.8 - 8.0 K/UL    Lymphocytes Absolute 2.4 0.8 - 3.5 K/UL    Monocytes Absolute 0.5 0.0 - 1.0 K/UL    Eosinophils Absolute 0.3 0.0 - 0.4 K/UL    Basophils Absolute 0.1 0.0 - 0.1 K/UL    Absolute Immature Granulocyte 0.0 0.00 - 0.04 K/UL    Differential Type AUTOMATED     CMP    Collection Time: 08/07/21  8:38 AM   Result Value Ref Range    Sodium 135 (L) 136 - 145 mmol/L    Potassium 3.9 3.5 - 5.1 mmol/L    Chloride 100 97 - 108 mmol/L    CO2 27 21 - 32 mmol/L    Anion Gap 8 5 - 15 mmol/L     Glucose 90 65 - 100 mg/dL    BUN 10 6 - 20 MG/DL    Creatinine 0.95 0.55 - 1.02 MG/DL    Bun/Cre Ratio 11 (L) 12 - 20      Est, Glom Filt Rate >60 >60 ml/min/1.59m    Calcium 8.7 8.5 - 10.1 MG/DL    Total Bilirubin 1.1 (H) 0.2 - 1.0 MG/DL    ALT 50 12 - 78 U/L    AST 55 (H) 15 - 37 U/L    Alk Phosphatase 55 45 - 117 U/L    Total Protein 7.4 6.4 - 8.2 g/dL    Albumin 3.9 3.5 - 5.0 g/dL    Globulin 3.5 2.0 - 4.0 g/dL    Albumin/Globulin Ratio 1.1 1.1 - 2.2     Urinalysis with Microscopic    Collection Time: 08/07/21  9:01 AM   Result Value Ref Range    Color, UA YELLOW/STRAW      Appearance CLEAR CLEAR      Specific Gravity, UA 1.017 1.003 - 1.030      pH, Urine 6.5 5.0 - 8.0      Protein, UA Negative NEG mg/dL    Glucose, UA Negative NEG mg/dL    Ketones, Urine 40 (A) NEG mg/dL    Bilirubin Urine Negative NEG      Blood, Urine Negative NEG      Urobilinogen, Urine 0.2 0.2 - 1.0 EU/dL    Nitrite, Urine  Negative NEG      Leukocyte Esterase, Urine LARGE (A) NEG      WBC, UA 10-20 0 - 4 /hpf    RBC, UA 0-5 0 - 5 /hpf    Epithelial Cells UA MODERATE (A) FEW /lpf    BACTERIA, URINE Negative NEG /hpf    Hyaline Casts, UA 0-2 0 - 2 /lpf   Urine Culture Hold Sample    Collection Time: 08/07/21  9:01 AM    Specimen: Urine   Result Value Ref Range    Specimen HOld        Urine on hold in Microbiology dept for 2 days.  If unpreserved urine is submitted, it cannot be used for addtional testing after 24 hours, recollection will be required.   EKG 12 Lead    Collection Time: 08/07/21  9:07 AM   Result Value Ref Range    Ventricular Rate 71 BPM    Atrial Rate 71 BPM    P-R Interval 146 ms    QRS Duration 78 ms    Q-T Interval 394 ms    QTc Calculation (Bazett) 428 ms    P Axis 37 degrees    R Axis 54 degrees    T Axis 13 degrees    Diagnosis       Normal sinus rhythm  Normal ECG  When compared with ECG of 28-Dec-2020 23:07,  No significant change was found         IMAGING RESULTS:  CT HEAD WO CONTRAST   Final Result   No acute  intracranial hemorrhage, mass or infarct.          CT CERVICAL SPINE WO CONTRAST   Final Result   No acute fracture or subluxation. CT myelography or MR can be   performed for further evaluation, as indicated.          XR CHEST PORTABLE    (Results Pending)       MEDICATIONS GIVEN:  Medications - No data to display    IMPRESSION:  1. Paresthesia    2. Numbness and tingling        PLAN:  1.      Medication List        ASK your doctor about these medications      CARVEDILOL PO     HYDROcodone-acetaminophen 7.5-325 MG per tablet  Commonly known as: NORCO     ibuprofen 600 MG tablet  Commonly known as: ADVIL;MOTRIN     LISINOPRIL PO     TESTOSTERONE BU            2. '@FUDC' @  3. Return to ED if worse      (Please note that portions of this note were completed with a voice recognition program.  Efforts were made to edit the dictations but occasionally words are mis-transcribed.)    Oda Cogan, APRN - NP (electronically signed)  Emergency Attending Physician / Physician Assistant / Nurse Practitioner            Oda Cogan, APRN - NP  08/07/21 [phone]

## 2021-08-07 NOTE — ED Notes (Signed)
Patient discharged by provider.  Discharge paperwork reviewed and patient denies questions.  Leaves ambulatory and in no apparent distress       Crista Curb, RN  08/07/21 1002

## 2021-08-07 NOTE — ED Triage Notes (Addendum)
Pt arrives with complaints of body tingling for 3 weeks. Today started in her mouth. No other symptoms associated. HX of panic attacks and has felt one build up but not turn into a panic attack per patient. Pr denies NVD or headache. Hx of brain bleed 3 years go from a fall down steps after drinking alcohol. PT drinks 1-3 "big beers a day". Pt has not had any imaging since his bleed. Pt doesn't follow up with neuro. Pt doesn't take BP as prescribed.

## 2021-08-07 NOTE — ED Notes (Signed)
[facility] [place], [place]  08/07/21 [phone]

## 2021-08-09 LAB — EKG 12-LEAD
Atrial Rate: 71 {beats}/min
Diagnosis: NORMAL
P Axis: 37 degrees
P-R Interval: 146 ms
Q-T Interval: 394 ms
QRS Duration: 78 ms
QTc Calculation (Bazett): 428 ms
R Axis: 54 degrees
T Axis: 13 degrees
Ventricular Rate: 71 {beats}/min

## 2021-10-17 ENCOUNTER — Emergency Department: Admit: 2021-10-18

## 2021-10-17 DIAGNOSIS — S29011A Strain of muscle and tendon of front wall of thorax, initial encounter: Secondary | ICD-10-CM

## 2021-10-17 NOTE — ED Provider Notes (Cosign Needed)
[facility] EMERGENCY DEPT  EMERGENCY DEPARTMENT ENCOUNTER      Pt Name: Maria Edwards  MRN: [id number]  Birthdate 1980-03-12  Date of evaluation: 10/17/2021  Provider: Tyson Babinski, PA-C    CHIEF COMPLAINT       Chief Complaint   Patient presents with    Rib Pain    Shortness of Breath         HISTORY OF PRESENT ILLNESS   (Location/Symptom, Timing/Onset, Context/Setting, Quality, Duration, Modifying Factors, Severity)  Note limiting factors.   41 year old patient who presents with fianc for evaluation of right-sided rib pain that came on suddenly 2 days ago after turning to the right and coughing and felt a "pop".  There has been intermittent pain ever since.  This afternoon was reaching for something with the right arm and the pain significantly worsen" it felt like it was tearing.  No history of PE or DVT.  Does take testosterone therapy.  No vomiting, diarrhea, substernal chest pain, previous cardiac history, calf pain, leg swelling. No history of pleurisy or cardiac issues.    The history is provided by the patient and a significant other.       Review of External Medical Records:     Nursing Notes were reviewed.    REVIEW OF SYSTEMS    (2-9 systems for level 4, 10 or more for level 5)     Review of Systems    Except as noted above the remainder of the review of systems was reviewed and negative.       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    IBUPROFEN (ADVIL;MOTRIN) 600 MG TABLET    Take 600 mg by mouth every 6 hours as needed    LISINOPRIL PO    Take by mouth    TESTOSTERONE BU    Place inside  cheek       ALLERGIES     Cefaclor    FAMILY HISTORY       Family History   Problem Relation Age of Onset    Hypertension Father           SOCIAL HISTORY       Social History     Socioeconomic History    Marital status: Divorced   Tobacco Use    Smoking status: Never    Smokeless tobacco: Never   Substance and Sexual Activity    Alcohol use: Yes     Alcohol/week: 6.0 standard drinks    Drug use: No           PHYSICAL EXAM    (up to 7 for level 4, 8 or more for level 5)     ED Triage Vitals [10/17/21 2028]   BP Temp Temp Source Pulse Respirations SpO2 Height Weight - Scale   137/87 98.2 F (36.8 C) Oral (!) 102 20 97 %  4\' 11"  (1.499 m) 120 lb (54.4 kg)       Body mass index is 24.24 kg/m.    Physical Exam  Vitals and nursing note reviewed.   Constitutional:       Comments: Transgender patient   HENT:      Head: Normocephalic.      Right Ear: Tympanic membrane normal.      Left Ear: Tympanic membrane normal.   Cardiovascular:      Rate and Rhythm: Normal rate.      Pulses: Normal pulses.      Heart sounds: Normal heart sounds. No murmur heard.    No friction rub. No gallop.   Pulmonary:      Effort: Pulmonary effort is normal. No respiratory distress.   Chest:      Chest wall: Tenderness present.       Abdominal:      General: Abdomen is flat.   Musculoskeletal:         General: Normal range of motion.   Skin:     General: Skin is warm.      Capillary Refill: Capillary refill takes less than 2 seconds.   Neurological:      General: No focal deficit present.      Mental Status: She is alert.       DIAGNOSTIC RESULTS     EKG: All EKG's are interpreted by the Emergency Department Physician who either signs or Co-signs this chart in the absence of a cardiologist.        RADIOLOGY:   Non-plain film images such as CT, Ultrasound and MRI are read by the radiologist. Plain radiographic images are visualized and preliminarily interpreted by the emergency physician with the below findings:        Interpretation per the  Radiologist below, if available at the time of this note:    CTA CHEST W WO CONTRAST   Final Result   No evidence of pulmonary embolus. No acute cardiac pulmonary process.              LABS:  Labs Reviewed   CBC WITH AUTO DIFFERENTIAL - Abnormal; Notable for the following components:       Result Value    RBC 5.32 (*)     Hemoglobin 16.5 (*)     Hematocrit 49.2 (*)     RDW 14.6 (*)     All other components within normal limits   COMPREHENSIVE METABOLIC PANEL - Abnormal; Notable for the following components:    Bun/Cre Ratio 11 (*)     AST 77 (*)     All other components within normal limits   TROPONIN   HCG, SERUM, QUALITATIVE   EXTRA TUBES HOLD       All other labs were within normal range or not returned as of this dictation.    EMERGENCY DEPARTMENT COURSE and DIFFERENTIAL DIAGNOSIS/MDM:   Vitals:    Vitals:    10/17/21 2028 10/17/21 2215 10/17/21 2230 10/17/21 2245   BP: 137/87 (!) 140/104 (!) 125/90 (!) 119/94   Pulse: (!) 102      Resp: 20      Temp: 98.2 F (36.8 C)      TempSrc: Oral      SpO2: 97% 97% 97% 95%   Weight: 54.4 kg (120 lb)      Height: 1.499 m (4\' 11" )              Medical Decision Making    DDx:  Muscle spasm, pleurisy, PTX, PE    Patient has some risk factors for PE due to testosterone therapy, is tachycardic and having pleuritic rib pain.  Discussed with Dr. Dedra Skeens and it was recommended to perform CTA chest to rule out any acute abnormalities and to further evaluate the area of concern.  CTA with no acute findings.  Will discharge with a few days of narcotic pain medication, muscle laxer, NSAID and incentive spirometer.  Instructed to avoid heavy lifting.  Primary care follow-up encouraged and return precautions discussed.    Amount and/or Complexity of Data Reviewed  Labs: ordered.  Radiology: ordered.  ECG/medicine tests: ordered.    Risk  Prescription drug management.            REASSESSMENT            CONSULTS:  None    PROCEDURES:  Unless otherwise noted below, none      Procedures      FINAL IMPRESSION      1. Chest wall muscle strain, initial encounter    2. Pleurisy          DISPOSITION/PLAN   DISPOSITION Decision To Discharge 10/17/2021 11:38:17 PM      PATIENT REFERRED TO:  [facility]  [address]  [place] [address]    primary care provider for follow-up.      DISCHARGE MEDICATIONS:  New Prescriptions    CYCLOBENZAPRINE (FLEXERIL) 10 MG TABLET    Take 1 tablet by mouth 3 times daily as needed for Muscle spasms    NAPROXEN (NAPROSYN) 500 MG TABLET    Take 1 tablet by mouth 2 times daily Take with food.    OXYCODONE-ACETAMINOPHEN (PERCOCET) 5-325 MG PER TABLET    Take 1 tablet by mouth every 6 hours as needed for Pain (take with food.) for up to 5 days. Intended supply: 3 days. Take lowest dose possible to manage pain Max Daily Amount: 4 tablets         (Please note that portions of this note were completed with a voice recognition program.  Efforts were made to edit the dictations but occasionally words are mis-transcribed.)    Tyson Babinski, PA-C (electronically signed)  Emergency Attending Physician / Physician Assistant / Nurse Practitioner             Ezekiel Slocumb, PA-C  10/18/21 [phone]

## 2021-10-17 NOTE — ED Provider Notes (Incomplete)
[facility] EMERGENCY DEPT  EMERGENCY DEPARTMENT ENCOUNTER      Pt Name: Maria Edwards  MRN: [id number]  Birthdate 02/07/1981  Date of evaluation: 10/17/2021  Provider: Tyson Babinski, PA-C    CHIEF COMPLAINT       Chief Complaint   Patient presents with   . Rib Pain   . Shortness of Breath         HISTORY OF PRESENT ILLNESS   (Location/Symptom, Timing/Onset, Context/Setting, Quality, Duration, Modifying Factors, Severity)  Note limiting factors.   41 year old patient who presents with fianc for evaluation of right-sided rib pain that came on suddenly 2 days ago after turning to the right and coughing and felt a "pop".  There has been intermittent pain ever since.  This afternoon was reaching for something with the right arm and the pain significantly worsen" it felt like it was tearing.  No history of PE or DVT.  Does take testosterone therapy.  No vomiting, diarrhea, substernal chest pain, previous cardiac history, calf pain, leg swelling. No history of pleurisy or cardiac issues.    The history is provided by the patient and a significant other.       Review of External Medical Records:     Nursing Notes were reviewed.    REVIEW OF SYSTEMS    (2-9 systems for level 4, 10 or more for level 5)     Review of Systems    Except as noted above the remainder of the review of systems was reviewed and negative.       PAST MEDICAL HISTORY     Past Medical History:   Diagnosis Date   . Hypertension    . 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 Traumatic brain injury          SURGICAL HISTORY       Past Surgical History:   Procedure Laterality Date   . WISDOM TOOTH EXTRACTION           CURRENT MEDICATIONS       Previous Medications    CARVEDILOL PO    Take by mouth    HYDROCODONE-ACETAMINOPHEN (NORCO) 7.5-325 MG PER TABLET    Take 1 tablet by mouth every 6 hours as needed.    IBUPROFEN (ADVIL;MOTRIN) 600 MG TABLET    Take 600 mg by mouth every 6 hours as needed    LISINOPRIL PO    Take by mouth    TESTOSTERONE BU    Place  inside cheek       ALLERGIES     Cefaclor    FAMILY HISTORY       Family History   Problem Relation Age of Onset   . Hypertension Father           SOCIAL HISTORY       Social History     Socioeconomic History   . Marital status: Divorced   Tobacco Use   . Smoking status: Never   . Smokeless tobacco: Never   Substance and Sexual Activity   . Alcohol use: Yes     Alcohol/week: 6.0 standard drinks   . Drug use: No           PHYSICAL EXAM    (up to 7 for level 4, 8 or more for level 5)     ED Triage Vitals [10/17/21 2028]   BP Temp Temp Source Pulse Respirations SpO2 Height Weight - Scale   137/87 98.2 F (36.8 C) Oral (!) 102 20 97 %  4\' 11"  (1.499 m) 120 lb (54.4 kg)       Body mass index is 24.24 kg/m.    Physical Exam  Vitals and nursing note reviewed.   Constitutional:       Comments: Transgender patient   HENT:      Head: Normocephalic.      Right Ear: Tympanic membrane normal.      Left Ear: Tympanic membrane normal.   Cardiovascular:      Rate and Rhythm: Normal rate.      Pulses: Normal pulses.      Heart sounds: Normal heart sounds. No murmur heard.    No friction rub. No gallop.   Pulmonary:      Effort: Pulmonary effort is normal. No respiratory distress.   Chest:      Chest wall: Tenderness present.       Abdominal:      General: Abdomen is flat.   Musculoskeletal:         General: Normal range of motion.   Skin:     General: Skin is warm.      Capillary Refill: Capillary refill takes less than 2 seconds.   Neurological:      General: No focal deficit present.      Mental Status: She is alert.       DIAGNOSTIC RESULTS     EKG: All EKG's are interpreted by the Emergency Department Physician who either signs or Co-signs this chart in the absence of a cardiologist.        RADIOLOGY:   Non-plain film images such as CT, Ultrasound and MRI are read by the radiologist. Plain radiographic images are visualized and preliminarily interpreted by the emergency physician with the below findings:        Interpretation  per the Radiologist below, if available at the time of this note:    CTA CHEST W WO CONTRAST   Final Result   No evidence of pulmonary embolus. No acute cardiac pulmonary process.              LABS:  Labs Reviewed   CBC WITH AUTO DIFFERENTIAL - Abnormal; Notable for the following components:       Result Value    RBC 5.32 (*)     Hemoglobin 16.5 (*)     Hematocrit 49.2 (*)     RDW 14.6 (*)     All other components within normal limits   COMPREHENSIVE METABOLIC PANEL - Abnormal; Notable for the following components:    Bun/Cre Ratio 11 (*)     AST 77 (*)     All other components within normal limits   TROPONIN   HCG, SERUM, QUALITATIVE   EXTRA TUBES HOLD       All other labs were within normal range or not returned as of this dictation.    EMERGENCY DEPARTMENT COURSE and DIFFERENTIAL DIAGNOSIS/MDM:   Vitals:    Vitals:    10/17/21 2028 10/17/21 2215 10/17/21 2230 10/17/21 2245   BP: 137/87 (!) 140/104 (!) 125/90 (!) 119/94   Pulse: (!) 102      Resp: 20      Temp: 98.2 F (36.8 C)      TempSrc: Oral      SpO2: 97% 97% 97% 95%   Weight: 54.4 kg (120 lb)      Height: 1.499 m (4\' 11" )              Medical Decision Making    DDx:  Muscle spasm, pleurisy, PTX, PE    Patient has some risk factors for PE due to testosterone therapy, is tachycardic and having pleuritic rib pain.  Discussed with Dr. Dedra Skeens and it was recommended to perform CTA chest to rule out any acute abnormalities and to further evaluate the area of concern.  CTA with no acute findings.  Will discharge with a few days of narcotic pain medication, muscle laxer, NSAID and incentive spirometer.  Instructed to avoid heavy lifting.  Primary care follow-up encouraged and return precautions discussed.    Amount and/or Complexity of Data Reviewed  Labs: ordered.  Radiology: ordered.  ECG/medicine tests: ordered.    Risk  Prescription drug management.            REASSESSMENT            CONSULTS:  None    PROCEDURES:  Unless otherwise noted below, none      Procedures      FINAL IMPRESSION      1. Chest wall muscle strain, initial encounter    2. Pleurisy          DISPOSITION/PLAN   DISPOSITION Decision To Discharge 10/17/2021 11:38:17 PM      PATIENT REFERRED TO:  [facility]  [address]  [place] [address]    primary care provider for follow-up.      DISCHARGE MEDICATIONS:  New Prescriptions    CYCLOBENZAPRINE (FLEXERIL) 10 MG TABLET    Take 1 tablet by mouth 3 times daily as needed for Muscle spasms    NAPROXEN (NAPROSYN) 500 MG TABLET    Take 1 tablet by mouth 2 times daily Take with food.    OXYCODONE-ACETAMINOPHEN (PERCOCET) 5-325 MG PER TABLET    Take 1 tablet by mouth every 6 hours as needed for Pain (take with food.) for up to 5 days. Intended supply: 3 days. Take lowest dose possible to manage pain Max Daily Amount: 4 tablets         (Please note that portions of this note were completed with a voice recognition program.  Efforts were made to edit the dictations but occasionally words are mis-transcribed.)    Tyson Babinski, PA-C (electronically signed)  Emergency Attending Physician / Physician Assistant / Nurse Practitioner

## 2021-10-17 NOTE — ED Triage Notes (Signed)
Patient ambulatory to Triage with c/o right upper rib pain that started two days ago. Patient states that she coughed and heard a pop and since then has been having pain.    Patient also reports that the area is swollen.

## 2021-10-18 ENCOUNTER — Inpatient Hospital Stay
Admit: 2021-10-18 | Discharge: 2021-10-18 | Disposition: A | Discharge status: Home / Self Care | Attending: Emergency Medicine

## 2021-10-18 LAB — COMPREHENSIVE METABOLIC PANEL
ALT: 53 U/L (ref 12–78)
AST: 77 U/L — ABNORMAL HIGH (ref 15–37)
Albumin/Globulin Ratio: 1.1 (ref 1.1–2.2)
Albumin: 4.2 g/dL (ref 3.5–5.0)
Alk Phosphatase: 48 U/L (ref 45–117)
Anion Gap: 8 mmol/L (ref 5–15)
BUN: 9 MG/DL (ref 6–20)
Bun/Cre Ratio: 11 — ABNORMAL LOW (ref 12–20)
CO2: 25 mmol/L (ref 21–32)
Calcium: 9.8 MG/DL (ref 8.5–10.1)
Chloride: 103 mmol/L (ref 97–108)
Creatinine: 0.85 MG/DL (ref 0.55–1.02)
Est, Glom Filt Rate: >60 mL/min/{1.73_m2} (ref >60)
Globulin: 3.8 g/dL (ref 2.0–4.0)
Glucose: 82 mg/dL (ref 65–100)
Potassium: 4 mmol/L (ref 3.5–5.1)
Sodium: 136 mmol/L (ref 136–145)
Total Bilirubin: 1 MG/DL (ref 0.2–1.0)
Total Protein: 8 g/dL (ref 6.4–8.2)

## 2021-10-18 LAB — CBC WITH AUTO DIFFERENTIAL
Absolute Immature Granulocyte: 0 10*3/uL (ref 0.00–0.04)
Basophils %: 1 % (ref 0–1)
Basophils Absolute: 0.1 10*3/uL (ref 0.0–0.1)
Eosinophils %: 2 % (ref 0–7)
Eosinophils Absolute: 0.1 10*3/uL (ref 0.0–0.4)
Hematocrit: 49.2 % — ABNORMAL HIGH (ref 35.0–47.0)
Hemoglobin: 16.5 g/dL — ABNORMAL HIGH (ref 11.5–16.0)
Immature Granulocytes: 0 % (ref 0.0–0.5)
Lymphocytes %: 23 % (ref 12–49)
Lymphocytes Absolute: 2.1 10*3/uL (ref 0.8–3.5)
MCH: 31 PG (ref 26.0–34.0)
MCHC: 33.5 g/dL (ref 30.0–36.5)
MCV: 92.5 FL (ref 80.0–99.0)
MPV: 9.3 FL (ref 8.9–12.9)
Monocytes %: 8 % (ref 5–13)
Monocytes Absolute: 0.8 10*3/uL (ref 0.0–1.0)
Neutrophils %: 66 % (ref 32–75)
Neutrophils Absolute: 5.9 10*3/uL (ref 1.8–8.0)
Nucleated RBCs: 0 PER 100 WBC
Platelets: 261 10*3/uL (ref 150–400)
RBC: 5.32 M/uL — ABNORMAL HIGH (ref 3.80–5.20)
RDW: 14.6 % — ABNORMAL HIGH (ref 11.5–14.5)
WBC: 9 10*3/uL (ref 3.6–11.0)
nRBC: 0 10*3/uL (ref 0.00–0.01)

## 2021-10-18 LAB — TROPONIN: Troponin, High Sensitivity: 8 ng/L (ref 0–51)

## 2021-10-18 LAB — EXTRA TUBES HOLD

## 2021-10-18 LAB — HCG, SERUM, QUALITATIVE: hCG Qual: NEGATIVE

## 2021-10-18 MED ORDER — CYCLOBENZAPRINE HCL 10 MG PO TABS
10 MG | ORAL | Status: AC
Start: 2021-10-18 — End: 2021-10-17
  Administered 2021-10-18: 02:00:00 10 mg via ORAL

## 2021-10-18 MED ORDER — CYCLOBENZAPRINE HCL 10 MG PO TABS
10 MG | ORAL_TABLET | Freq: Three times a day (TID) | ORAL | 0 refills | Status: DC | PRN
Start: 2021-10-18 — End: 2022-05-27

## 2021-10-18 MED ORDER — MORPHINE SULFATE (PF) 4 MG/ML IJ SOLN
4 MG/ML | INTRAMUSCULAR | Status: AC
Start: 2021-10-18 — End: 2021-10-17
  Administered 2021-10-18: 02:00:00 4 mg via INTRAVENOUS

## 2021-10-18 MED ORDER — OXYCODONE-ACETAMINOPHEN 5-325 MG PO TABS
5-325 MG | ORAL | Status: AC
Start: 2021-10-18 — End: 2021-10-17
  Administered 2021-10-18: 04:00:00 1 via ORAL

## 2021-10-18 MED ORDER — KETOROLAC TROMETHAMINE 30 MG/ML IJ SOLN
30 MG/ML | INTRAMUSCULAR | Status: AC
Start: 2021-10-18 — End: 2021-10-17
  Administered 2021-10-18: 02:00:00 30 mg via INTRAVENOUS

## 2021-10-18 MED ORDER — NAPROXEN 500 MG PO TABS
500 MG | ORAL_TABLET | Freq: Two times a day (BID) | ORAL | 0 refills | Status: DC
Start: 2021-10-18 — End: 2022-05-27

## 2021-10-18 MED ORDER — OXYCODONE-ACETAMINOPHEN 5-325 MG PO TABS
5-325 MG | ORAL_TABLET | Freq: Four times a day (QID) | ORAL | 0 refills | Status: AC | PRN
Start: 2021-10-18 — End: 2021-10-22

## 2021-10-18 MED ORDER — IOPAMIDOL 76 % IV SOLN
76 % | Freq: Once | INTRAVENOUS | Status: AC | PRN
Start: 2021-10-18 — End: 2021-10-17
  Administered 2021-10-18: 03:00:00 75 mL via INTRAVENOUS

## 2021-10-18 MED ORDER — SODIUM CHLORIDE 0.9 % IV BOLUS
0.9 % | Freq: Once | INTRAVENOUS | Status: AC
Start: 2021-10-18 — End: 2021-10-17
  Administered 2021-10-18: 02:00:00 1000 mL via INTRAVENOUS

## 2021-10-18 MED FILL — OXYCODONE-ACETAMINOPHEN 5-325 MG PO TABS: 5-325 MG | ORAL | Qty: 1

## 2021-10-18 MED FILL — CYCLOBENZAPRINE HCL 10 MG PO TABS: 10 MG | ORAL | Qty: 1

## 2021-10-18 MED FILL — SODIUM CHLORIDE 0.9 % IV SOLN: 0.9 % | INTRAVENOUS | Qty: 1000

## 2021-10-18 MED FILL — MORPHINE SULFATE 4 MG/ML IJ SOLN: 4 mg/mL | INTRAMUSCULAR | Qty: 1

## 2021-10-18 MED FILL — ISOVUE-370 76 % IV SOLN: 76 % | INTRAVENOUS | Qty: 100

## 2021-10-18 MED FILL — KETOROLAC TROMETHAMINE 30 MG/ML IJ SOLN: 30 MG/ML | INTRAMUSCULAR | Qty: 1

## 2021-10-19 LAB — EKG 12-LEAD
Atrial Rate: 90 {beats}/min
Diagnosis: NORMAL
P Axis: 64 degrees
P-R Interval: 182 ms
Q-T Interval: 368 ms
QRS Duration: 74 ms
QTc Calculation (Bazett): 450 ms
R Axis: 33 degrees
T Axis: 20 degrees
Ventricular Rate: 90 {beats}/min

## 2022-03-02 ENCOUNTER — Inpatient Hospital Stay
Admit: 2022-03-02 | Discharge: 2022-03-02 | Disposition: A | Discharge status: Home / Self Care | Attending: Emergency Medicine

## 2022-03-02 DIAGNOSIS — R202 Paresthesia of skin: Secondary | ICD-10-CM

## 2022-03-02 LAB — COMPREHENSIVE METABOLIC PANEL
ALT: 25 U/L (ref 12–78)
AST: 27 U/L (ref 15–37)
Albumin/Globulin Ratio: 0.9 — ABNORMAL LOW (ref 1.1–2.2)
Albumin: 3.4 g/dL — ABNORMAL LOW (ref 3.5–5.0)
Alk Phosphatase: 59 U/L (ref 45–117)
Anion Gap: 5 mmol/L (ref 5–15)
BUN: 12 MG/DL (ref 6–20)
Bun/Cre Ratio: 16 (ref 12–20)
CO2: 26 mmol/L (ref 21–32)
Calcium: 8.8 MG/DL (ref 8.5–10.1)
Chloride: 108 mmol/L (ref 97–108)
Creatinine: 0.74 MG/DL (ref 0.55–1.02)
Est, Glom Filt Rate: >60 mL/min/{1.73_m2} (ref >60)
Globulin: 3.8 g/dL (ref 2.0–4.0)
Glucose: 103 mg/dL — ABNORMAL HIGH (ref 65–100)
Potassium: 4.2 mmol/L (ref 3.5–5.1)
Sodium: 139 mmol/L (ref 136–145)
Total Bilirubin: 0.8 MG/DL (ref 0.2–1.0)
Total Protein: 7.2 g/dL (ref 6.4–8.2)

## 2022-03-02 LAB — CBC WITH AUTO DIFFERENTIAL
Absolute Immature Granulocyte: 0 10*3/uL (ref 0.00–0.04)
Basophils %: 1 % (ref 0–1)
Basophils Absolute: 0.1 10*3/uL (ref 0.0–0.1)
Eosinophils %: 2 % (ref 0–7)
Eosinophils Absolute: 0.2 10*3/uL (ref 0.0–0.4)
Hematocrit: 43.2 % (ref 35.0–47.0)
Hemoglobin: 14.2 g/dL (ref 11.5–16.0)
Immature Granulocytes: 0 % (ref 0.0–0.5)
Lymphocytes %: 12 % (ref 12–49)
Lymphocytes Absolute: 1.4 10*3/uL (ref 0.8–3.5)
MCH: 29.3 PG (ref 26.0–34.0)
MCHC: 32.9 g/dL (ref 30.0–36.5)
MCV: 89.3 FL (ref 80.0–99.0)
MPV: 10.7 FL (ref 8.9–12.9)
Monocytes %: 7 % (ref 5–13)
Monocytes Absolute: 0.7 10*3/uL (ref 0.0–1.0)
Neutrophils %: 78 % — ABNORMAL HIGH (ref 32–75)
Neutrophils Absolute: 8.5 10*3/uL — ABNORMAL HIGH (ref 1.8–8.0)
Nucleated RBCs: 0 PER 100 WBC
Platelets: 328 10*3/uL (ref 150–400)
RBC: 4.84 M/uL (ref 3.80–5.20)
RDW: 12.9 % (ref 11.5–14.5)
WBC: 10.9 10*3/uL (ref 3.6–11.0)
nRBC: 0 10*3/uL (ref 0.00–0.01)

## 2022-03-02 LAB — EXTRA TUBES HOLD

## 2022-03-02 LAB — MAGNESIUM: Magnesium: 2 mg/dL (ref 1.6–2.4)

## 2022-03-02 MED ORDER — GABAPENTIN 100 MG PO CAPS
100 MG | ORAL_CAPSULE | ORAL | 0 refills | Status: DC
Start: 2022-03-02 — End: 2022-05-27

## 2022-03-02 MED ORDER — SODIUM CHLORIDE 0.9 % IV BOLUS
0.9 % | Freq: Once | INTRAVENOUS | Status: AC
Start: 2022-03-02 — End: 2022-03-02
  Administered 2022-03-02: 12:00:00 1000 mL via INTRAVENOUS

## 2022-03-02 MED FILL — SODIUM CHLORIDE 0.9 % IV SOLN: 0.9 % | INTRAVENOUS | Qty: 1000

## 2022-03-02 NOTE — Discharge Instructions (Signed)
EMG can help localize where your paresthesias are coming from.  If it is eventually localized to the elbow or wrist seeing an orthopedic surgeon such as Dr. Isaac Bliss and [facility] can be helpful.

## 2022-03-02 NOTE — ED Notes (Signed)
ED SIGN OUT NOTE  Care assumed at [facility] 7:18 AM EST    Patient was signed out to me by Dr. Gayla Doss.     Patient is awaiting labs, fluids reassessment.    BP 110/81   Pulse 80   Temp 98.7 F (37.1 C) (Oral)   Resp 17   Ht 1.499 m (4\' 11" )   Wt 49.9 kg (110 lb)   SpO2 96%   BMI 22.22 kg/m     Labs Reviewed   EXTRA TUBES HOLD   COMPREHENSIVE METABOLIC PANEL   CBC WITH AUTO DIFFERENTIAL   MAGNESIUM     No orders to display       ED Course as of 03/02/22 0718   Sat Mar 02, 2022   0656 Signout  41FTM here with bilateral hand paresthesias. Prior ETOH abuse.   Pending labs, fluids, reassess.  If stable can be discharged on gabapentin with neuro follow up. [AS]      ED Course User Index  [AS] Loree Fee, MD       Diagnosis:   1. Paresthesia of both hands        Disposition:   Decision To Discharge 03/02/2022 06:46:28 AM    Plan:   As per signout, patient reassessed after lab work.  Labs all reassuring without evidence of acute metabolic disarray or infection.  Vital signs remained stable.  Patient with nonspecific paresthesias stable for discharge with neurology follow-up.    Loree Fee, MD       Loree Fee, MD  03/02/22 2005

## 2022-03-02 NOTE — ED Provider Notes (Signed)
HPI    41 y.o. FTM transgender female presenting with paresthesias of bilateral hands.  Started over the past several months a little worse in the past several days.  Notes worsening pain in the morning.  Primarily involving the first through third digits.  Exacerbated by wrist flexion.  He is left-hand dominant, more somewhat in the left hand.  He notes the paresthesias radiate up the arm as well.  No neck pain per se.  No lower extremity symptoms.  Denies headache or fever, recent instrumentation.  Only medication currently is testosterone which has been ongoing for about 1 year.    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       Physical Exam  Vitals reviewed.   Constitutional:       General: She is not in acute distress.     Appearance: Normal appearance.   HENT:      Head: Normocephalic.      Mouth/Throat:      Mouth: Mucous membranes are moist.   Eyes:      Extraocular Movements: Extraocular movements intact.      Pupils: Pupils are equal, round, and reactive to light.   Cardiovascular:      Rate and Rhythm: Normal rate and regular rhythm.      Pulses: Normal pulses.   Pulmonary:      Effort: Pulmonary effort is normal. No respiratory distress.   Abdominal:      General: Abdomen is flat.   Musculoskeletal:      Cervical back: Neck supple. No rigidity.      Right lower leg: No edema.      Left lower leg: No edema.   Skin:     General: Skin is warm.      Capillary Refill: Capillary refill takes less than 2 seconds.   Neurological:      General: No focal deficit present.      Mental Status: She is alert. Mental status is at baseline.      Cranial Nerves: Cranial nerves 2-12 are intact. No cranial nerve deficit, dysarthria or facial asymmetry.      Sensory: Sensation is intact.      Motor: Motor function is intact. No weakness or pronator drift.      Coordination: Coordination normal.      Gait: Gait normal.      Comments: Negative  chvostek sign  , Tinel's sign.   Psychiatric:         Mood and Affect: Mood normal.           LABORATORY TESTS:  Labs Reviewed   COMPREHENSIVE METABOLIC PANEL - Abnormal; Notable for the following components:       Result Value    Glucose 103 (*)     Albumin 3.4 (*)     Albumin/Globulin Ratio 0.9 (*)     All other components within normal limits   CBC WITH AUTO DIFFERENTIAL - Abnormal; Notable for the following components:    Neutrophils % 78 (*)     Neutrophils Absolute 8.5 (*)     All other components within normal limits   EXTRA TUBES HOLD   MAGNESIUM       IMAGING RESULTS:  No orders to display       MEDICATIONS GIVEN:  Medications   sodium chloride 0.9 % bolus 1,000 mL (0 mLs IntraVENous Stopped 03/02/22 0933)       CONSULTS:  None    ED Course as of 03/03/22 0722   Sat Mar 02, 2022   0656 Signout  41FTM here with bilateral hand paresthesias. Prior ETOH abuse.   Pending labs, fluids, reassess.  If stable can be discharged on gabapentin with neuro follow up. [AS]      ED Course User Index  [AS] Shelva Majestic, MD       Medical Decision Making  Amount and/or Complexity of Data Reviewed  Labs: ordered.    Risk  Prescription drug management.      Paresthesia of unclear origin, likely peripheral nerve compression although the exact location is unclear although suspect carpal tunnel or elbow etiology.  He does not have obvious weakness.    Doubt stroke, spinal epidural compression from infection  Hematoma.  Atraumatic.    IMPRESSION:  1. Paresthesia of both hands           DISPOSITION: Decision To Discharge 03/02/2022 06:46:28 AM      PATIENT REFERRED TO:  Dorice Lamas, MD  [address]  [place] [address]  [phone]    Schedule an appointment as soon as possible for a visit       [facility]  [address]  [place] [address]  [phone]  Schedule an appointment as soon as possible for a visit       Carlisle Cater, MD  [address]  [address]  [place] [address]  [phone]            DISCHARGE MEDICATIONS:  Discharge Medication List as of 03/02/2022  9:19 AM        START taking these medications    Details   gabapentin (NEURONTIN) 100 MG capsule Take one tablet three times daily initially, increase by one tablet per day to a max of two tablets three times daily, Disp-60 capsule, R-0Normal             Sharlette Dense, MD      Please note that this dictation was completed with Dragon, the computer voice recognition software.  Quite often unanticipated grammatical, syntax, homophones, and other interpretive errors are inadvertently transcribed by the computer software.  Please disregard these errors.  Please excuse any errors that have escaped final proofreading.    Full note to follow briefly patient with worsening paresthesias in bilateral hands without objective weakness.  Plan to treat as paresthesias with gabapentin and follow-up with neurology for EMG and localization.     Cam Hai, MD  03/03/22 [phone]

## 2022-03-02 NOTE — ED Notes (Signed)
Discharge instructions reviewed with patient. Opportunity to ask questions given.

## 2022-03-02 NOTE — ED Triage Notes (Signed)
Intermittent bilateral hand numbness for 2 weeks. Numbness in bilateral more constant for 2 days. Patient describes and burning sensation to fingers

## 2022-03-05 NOTE — Telephone Encounter (Signed)
Patient would like to schedule an EMG. She was recently 03/02/22 in [place] for tingling and numbness in both hands.    She was told by the ER they do not give referrals

## 2022-03-06 NOTE — Telephone Encounter (Signed)
Called the patient and scheduled a new patient appointment with Dr. Darnell Level on 05/27/2022.

## 2022-05-27 ENCOUNTER — Ambulatory Visit: Admit: 2022-05-27 | Attending: Neurology

## 2022-05-27 DIAGNOSIS — R200 Anesthesia of skin: Secondary | ICD-10-CM

## 2022-05-27 NOTE — Progress Notes (Addendum)
NEUROLOGY CLINIC NOTE    Patient ID:  Maria Edwards  [id number]  42 y.o.  [birth year]    Date of Consultation:  May 27, 2022    Reason for Consultation:  numbness and tingling both hands    Chief Complaint   Patient presents with    New Patient     Patient reports numbness and tingling in both hands since 08/2021.       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 Authorizing Provider   TESTOSTERONE BU Place inside cheek every 7 days   Yes Automatic Reconciliation, Ar   ibuprofen (ADVIL;MOTRIN) 600 MG tablet Take 1 tablet by mouth every 6 hours as needed 09/17/13  Yes Automatic Reconciliation, Ar   gabapentin (NEURONTIN) 100 MG capsule Take one tablet three times daily initially, increase by one tablet per day to a max of two tablets three times daily 03/02/22 04/01/22  Bari Mantis, MD   cyclobenzaprine (FLEXERIL) 10 MG tablet Take 1 tablet by mouth 3 times daily as needed for Muscle spasms 10/17/21   Chaya Jan, PA-C   naproxen (NAPROSYN) 500 MG tablet Take 1 tablet by mouth 2 times daily Take with food.  Patient not taking: Reported on 05/27/2022 10/17/21   Chaya Jan, PA-C   CARVEDILOL PO Take by mouth    Automatic Reconciliation, Ar   LISINOPRIL PO Take by mouth    Automatic Reconciliation, Ar   HYDROcodone-acetaminophen (NORCO) 7.5-325 MG per tablet Take 1 tablet by mouth every 6 hours as needed. 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 female [place] with history of hypertension and transgender person on hormone therapy who is here for further evaluation of numbness both hands.     Condition has been ongoing since 08/2021  Numbness and tingling   Started initially left 5th finger then spread bilaterally   Mostly thumb or pointer fingers  Uses of hands may cause it to lock up  Tornique sensation in both arms  Constant sensation  Sense of weakness with use of hands  Problems opening bottles and etc.  Some shoulder pains that go down  the arms    No similar issues in the legs  Issues with coordination    [place] patient fell and hit the head 3 years PTC  Told may need a neuroimaging done    Review of medical records revealed:  Patient was seen in the ER last 03/02/2022.  Note mentions patient complaining of bilateral hand paresthesia ongoing for the past several months and a little worse in the past several days.  Worsening pain in the morning.  Primarily involving the first 3 digits.  Exacerbated by wrist flexion.  Paresthesia radiates up the arm as well.  No neck pain.  Only medication was testosterone which has been ongoing for about a year.  Labs done revealed unremarkable CBC, magnesium, CMP.  Patient advised to have EMG/NCS done care of neurology.    Outside reports reviewed: ER records and lab reports.    Review of Systems:    A comprehensive review of systems was performed:   Constitutional: positive for poor appetite, fatigue  Eyes: positive for none  Ears, nose, mouth, throat, and face: positive for none  Respiratory: positive for shortness of breath  Cardiovascular: positive for chest pain, high blood  pressure  Gastrointestinal: positive for abdominal pain, diarrhea, nausea  Genitourinary: positive for none  Integument/breast: positive for none  Hematologic/lymphatic: positive for none  Musculoskeletal: positive for joint pain, muscle pain  Neurological: positive for headache, memory loss  Behavioral/Psych: positive for anxiety, depression  Endocrine: positive for none  Allergic/Immunologic: positive for none    Objective:     BP 136/80   Pulse 97   Temp 97.8 F (36.6 C) (Temporal)   Resp 16   Ht 1.499 m (4\' 11" )   Wt 49.9 kg (110 lb)   SpO2 98%   BMI 22.22 kg/m     PHYSICAL EXAM:    General Appearance: Alert, patient appears stated age.   General:  Well developed, well nourished, patient in no apparent distress.   Head/Face: The head is normocephalic and atraumatic.   Eyes: Conjunctivae appear normal. Sclera appear normal and non-icteric.   Nose (and Sinus):   No abnormality of the nose or sinuses is noted.   Oral:   Throat is clear.   Lymphatics:  No lymphadenopathy in the neck/head.   Neck and Thyroid:   No bruits noted in the neck.   Respiratory:  Lungs clear to auscultation.   Cardiovascular:  Palpation and auscultation: regular rate and rhythm.   Extremity: No joint swelling, erythema or pedal edema.      NEUROLOGICAL EXAM:    Appearance:  The patient is well developed, well nourished, provides a coherent history and is in no acute distress.   Mental Status: Oriented to time, place and person. Fluent, no aphasia or dysarthria. Mood and affect appropriate.   Cranial Nerves:   Intact visual fields. PERLA, EOM's full, no nystagmus, no ptosis. Facial sensation is normal. Corneal reflexes are intact. Facial movement is symmetric. Hearing is normal bilaterally. Palate is midline with normal elevation. Sternocleidomastoid and trapezius muscles are normal. Tongue is midline.   Motor:  5/5 strength in upper and lower proximal and distal muscles. Normal bulk and tone. No fasciculations. No pronator drift.     Reflexes:   Deep tendon reflexes 2+/4 and symmetrical. Downgoing toes.   Sensory:   Normal to cold (except bilateral palm and fingers), pinprick and vibration. (-) Tinel's bilateral wrist and elbows   Gait:  Normal gait. No Romberg. Can do tandem walking.  Tremor:   No tremor noted.   Cerebellar:  Intact FTN/RAM/HTS.   Neurovascular:  Normal heart sounds and regular rhythm, peripheral pulses intact, and no carotid bruits.     Labs Reviewed      Assessment:      Diagnosis Orders   1. Numbness and tingling in both hands  EMG NCV MOTOR WITH F/WAVE PER NERVE           Plan:   Neurological examination reveals decreased cold but intact pinprick sensation bilateral palms and fingertips.  No clear indication on exam of a glove sensory deficit or changes typically seen in entrapment neuropathies.  However given patient's chronicity of symptoms it is prudent to assess further.  EMG/NCS of bilateral upper extremity was ordered to assess for possible entrapment neuropathy, emerging neuropathy versus cervical radiculopathy.  Further intervention be done pending results of testing.  Patient was reassured that this is not correlated to prior head injury.  All questions and concerns were answered.    Visit lasted 45 minutes.  Greater than 50% was spent reviewing available medical records as summarized above, discussion about the condition, potential etiology, prognosis, need for EMG/NCS    This note was created using voice recognition software. Despite editing, there may be syntax errors.

## 2022-05-29 ENCOUNTER — Ambulatory Visit: Admit: 2022-05-29

## 2022-05-29 DIAGNOSIS — G5603 Carpal tunnel syndrome, bilateral upper limbs: Secondary | ICD-10-CM

## 2022-05-29 NOTE — Progress Notes (Signed)
EMG/ NCS Report  [facility]  [address] [place], [place]  [address]   Ph: [phone]   FAX: [phone]    Test Date:  05/29/2022    Patient: Maria Edwards, Maria Edwards DOB: [birth year] Physician: Elisha Headland MD   ID#: [id number] SEX: Female Ref. Phys: Elisha Headland MD   Tech: Jamie Kato    Patient History / Exam:  Patient complaining of chronic numbness and tingling bilateral thumb and index fingers with sense of weakness and shoulder pain radiating to the arms. Exam reveals decrease cold sensation bilateral palms and fingers with intact PP. Assess for neuropathy vs radiculopathy.     EMG & NCV Findings:  Sensory and motor nerve conduction studies (as indicated in the tables) were within reference of normal except for prolonged L>R median sensory peak and distal motor latencies with decrease left sensory nerve action potential amplitudes and mild slowing of the right motor conduction velocity.    All F Wave latencies were within normal limits.  All F Wave left vs. right side latency differences were within normal limits.      Disposable concentric needle EMG (as indicated in the table) was normal except for mild denervation and neuropathic recruitment changes left APB muscle.     Impressions:   This study is abnormal.  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 There is no evidence of a generalized neuropathy, myopathy or significant cervical radiculopathy at this time.     Patient referred to hand specialist.     Elisha Headland MD    Nerve Conduction Studies  Anti Sensory Summary Table     Stim Site NR Peak (ms) Norm Peak (ms) P-T Amp (V) Norm P-T Amp Site1 Site2 Dist (cm)   Left Median Anti Sensory (2nd Digit)  33.3 C   Wrist    5.2 <4 5.6 >13 Wrist 2nd Digit 14.0   Elbow    5.1  4.3  Elbow Wrist 0.0   Right Median  Anti Sensory (2nd Digit)  31.1 C   Wrist    4.9 <4 46.5 >13 Wrist 2nd Digit 14.0   Left Radial Anti Sensory (Base 1st Digit)  32.7 C   Wrist    1.8 <2.8 88.9 >11 Wrist Base 1st Digit 10.0   Right Radial Anti Sensory (Base 1st Digit)  31.2 C   Wrist    2.0 <2.8 68.1 >11 Wrist Base 1st Digit 10.0   Left Ulnar Anti Sensory (5th Digit)  32.9 C   Wrist    3.5 <4.0 42.7 >9 Wrist 5th Digit 14.0   Right Ulnar Anti Sensory (5th Digit)  31.2 C   Wrist    3.4 <4.0 62.6 >9 Wrist 5th Digit 14.0   B Elbow    3.5  51.8  B Elbow Wrist 0.0     Motor Summary Table     Stim Site NR Onset (ms) Norm Onset (ms) O-P Amp (mV) Norm O-P Amp Amp (Prev) (%) Site1 Site2 Dist (cm) Vel (m/s) Norm Vel (m/s)   Left Median Motor (Abd Poll Brev)  32.7 C   Wrist    6.1 <4.5 8.2 >4.1 100.0 Wrist Abd Poll Brev 8.0     Elbow    8.8  8.7  106.1 Elbow Wrist 16.5 61 >49   Right Median Motor (Abd Poll Brev)  31.2 C  Wrist    5.0 <4.5 9.5 >4.1 100.0 Wrist Abd Poll Brev 8.0     Elbow    8.3  9.7  102.1 Elbow Wrist 16.0 48 >49   Left Ulnar Motor (Abd Dig Minimi)  31.8 C   Wrist    3.0 <3.1 9.5 >7.0 100.0 Wrist Abd Dig Minimi 8.0  >50   B Elbow    6.0  9.6  101.1 B Elbow Wrist 17.0 57 >50   A Elbow    7.5  9.4  97.9 A Elbow B Elbow 10.0 67 >50   Right Ulnar Motor (Abd Dig Minimi)  31.4 C   Wrist    3.1 <3.1 11.5 >7.0 100.0 Wrist Abd Dig Minimi 8.0  >50   B Elbow    6.1  11.4  99.1 B Elbow Wrist 15.5 52 >50   A Elbow    8.0  11.0  96.5 A Elbow B Elbow 10.0 53 >50     F Wave Studies     NR F-Lat (ms) Lat Norm (ms) L-R F-Lat (ms) L-R Lat Norm   Left Ulnar (Mrkrs) (Abd Dig Min)  32.1 C      23.68 <32 2.06 <2.5   Right Ulnar (Mrkrs) (Abd Dig Min)  31.4 C      25.74 <32 2.06 <2.5       EMG     Side Muscle Nerve Root Ins Act Fibs Psw Recrt Duration Amp Poly Comment   Left Deltoid Axillary C5-6 Nml Nml Nml Nml Nml Nml Nml    Left Triceps Radial C6-7-8 Nml Nml Nml Nml Nml Nml Nml    Left Biceps Musculocut C5-6 Nml Nml Nml Nml Nml Nml Nml    Left 1stDorInt  Ulnar C8-T1 Nml Nml Nml Nml Nml Nml Nml    Left Abd Poll Brev Median C8-T1 Incr Nml 1+ Reduced Incr Incr 2+    Left Lower Cerv Parasp Rami C7,T1 Nml Nml Nml Nml Nml Nml Nml    Left Mid Cerv Parasp Rami C5,6 Nml Nml Nml Nml Nml Nml Nml    Right Deltoid Axillary C5-6 Nml Nml Nml Nml Nml Nml Nml    Right Triceps Radial C6-7-8 Nml Nml Nml Nml Nml Nml Nml    Right Biceps Musculocut C5-6 Nml Nml Nml Nml Nml Nml Nml    Right 1stDorInt Ulnar C8-T1 Nml Nml Nml Nml Nml Nml Nml    Right Abd Poll Brev Median C8-T1 Nml Nml Nml Nml Nml Nml Nml    Right Lower Cerv Parasp Rami C7,T1 Nml Nml Nml Nml Nml Nml Nml    Right Mid Cerv Parasp Rami C5,6 Nml Nml Nml Nml Nml Nml Nml      Waveforms:

## 2022-06-06 ENCOUNTER — Encounter: Admit: 2022-06-06 | Discharge: 2022-06-06 | Attending: Specialist

## 2022-06-06 DIAGNOSIS — M79641 Pain in right hand: Secondary | ICD-10-CM

## 2022-06-06 MED ORDER — METHYLPREDNISOLONE 4 MG PO TBPK
4 | PACK | ORAL | 0 refills | Status: AC
Start: 2022-06-06 — End: ?

## 2022-06-06 NOTE — Patient Instructions (Signed)
Patient Education        Carpal Tunnel Syndrome: Care Instructions  Overview     Carpal tunnel syndrome is numbness, tingling, weakness, and pain in your hand, wrist, and sometimes forearm. It is caused by pressure on the median nerve. This nerve and several tough tissues called tendons run through a space in the wrist. This space is called the carpal tunnel.  The repeated hand motions used in work and some hobbies and sports can put pressure on the median nerve. Pregnancy can cause carpal tunnel syndrome. Several conditions, such as diabetes, arthritis, and an underactive thyroid, can also cause it.  You may be able to limit an activity or change the way you do it to reduce your symptoms. You also can take other steps to feel better. If your symptoms are mild, 1 to 2 weeks of home treatment are likely to ease your pain. Surgery is needed only if other treatments do not work.  Follow-up care is a key part of your treatment and safety. Be sure to make and go to all appointments, and call your doctor if you are having problems. It's also a good idea to know your test results and keep a list of the medicines you take.  How can you care for yourself at home?  If possible, stop or reduce the activity that causes your symptoms. If you cannot stop the activity, take frequent breaks to rest and stretch or change hand positions to do a task. Try switching hands, such as when using a computer mouse.  Try to avoid bending or twisting your wrists.  Ask your doctor if you can take an over-the-counter pain medicine, such as acetaminophen (Tylenol), ibuprofen (Advil, Motrin), or naproxen (Aleve). Be safe with medicines. Read and follow all instructions on the label.  If your doctor prescribes corticosteroid medicine to help reduce pain and swelling, take it exactly as prescribed. Call your doctor if you think you are having a problem with your medicine.  Put ice or a cold pack on your wrist for 10 to 20 minutes at a time to ease  pain. Put a thin cloth between the ice and your skin.  If your doctor or your physical or occupational therapist tells you to wear a wrist splint, wear it as directed to keep your wrist in a neutral position. This also eases pressure on your median nerve.  Ask your doctor whether you should have physical or occupational therapy to learn how to do tasks differently.  Try a yoga class to stretch your muscles and build strength in your hands and wrists. Yoga has been shown to ease carpal tunnel symptoms.  To prevent carpal tunnel  When working at a computer, keep your hands and wrists in line with your forearms. Hold your elbows close to your sides. Take a break every 10 to 15 minutes.  Try these exercises:  Warm up: Rotate your wrist up, down, and from side to side. Repeat this 4 times. Stretch your fingers far apart, relax them, then stretch them again. Repeat 4 times. Stretch your thumb by pulling it back gently, holding it, and then releasing it. Repeat 4 times.  Prayer stretch: Start with your palms together in front of your chest just below your chin. Slowly lower your hands toward your waistline while keeping your hands close to your stomach and your palms together until you feel a mild to moderate stretch under your forearms. Hold for 10 to 20 seconds. Repeat 4 times.  Wrist   flexor stretch: Hold your arm in front of you with your palm up. Bend your wrist, pointing your hand toward the floor. With your other hand, gently bend your wrist further until you feel a mild to moderate stretch in your forearm. Hold for 10 to 20 seconds. Repeat 4 times.  Wrist extensor stretch: Repeat the steps for the wrist flexor stretch, but begin with your extended hand palm down.  Squeeze a rubber exercise ball several times a day to keep your hands and fingers strong.  Avoid holding objects (such as a book) in one position for a long time. When possible, use your whole hand to grasp an object. Using just the thumb and index finger  can put stress on the wrist.  Do not smoke. It can make this condition worse by reducing blood flow to the median nerve. If you need help quitting, talk to your doctor about stop-smoking programs and medicines. These can increase your chances of quitting for good.  When should you call for help?  Watch closely for changes in your health, and be sure to contact your doctor if:    Your pain or other problems do not get better with home care.     You want more information about physical or occupational therapy.     You have side effects of your corticosteroid medicine, such as:  Weight gain.  Mood changes.  Trouble sleeping.  Bruising easily.     You have any other problems with your medicine.   Where can you learn more?  Go to https://www.healthwise.net/patientEd and enter R432 to learn more about "Carpal Tunnel Syndrome: Care Instructions."  Current as of: September 17, 2021               Content Version: 14.0   2006-2024 [employer].   Care instructions adapted under license by [facility]. If you have questions about a medical condition or this instruction, always ask your healthcare professional. [employer] disclaims any warranty or liability for your use of this information.

## 2022-06-06 NOTE — Progress Notes (Signed)
HPI: Maria Edwards (DOB: Feb 27, 1981) is a 42 y.o. female, patient, here for evaluation of the following chief complaint(s):    Hand Pain (Pt is here for evaluation of bilateral hand pain, numbness and tingling x 6 months.)  Patient is seen today to evaluate her wrist.  The patient has developed numbness, tingling paresthesias following the median nerve distribution more profound on the left than the right wrist over the last year.  She denies any specific fall or other injury.  She complains of nocturnal paresthesias that can awaken her from her sleep.  She underwent electrodiagnostic assessment on 05/29/2022 that confirmed moderate left and mild right wrist carpal tunnel syndrome but no evidence of any generalized neuropathy, myopathy or cervical radiculopathy.  She tried a brace on the right side but did not have improvement.  She is left-hand dominant.    Vitals:  BP 106/72 (Site: Left Upper Arm, Position: Sitting, Cuff Size: Medium Adult)   Pulse 89   Temp 97.8 F (36.6 C) (Oral)   Ht 1.499 m (4\' 11" )   Wt 49.9 kg (110 lb)   SpO2 98%   BMI 22.22 kg/m     Body mass index is 22.22 kg/m.    Allergies   Allergen Reactions    Cefaclor Rash       Current Outpatient Medications   Medication Sig Dispense Refill    methylPREDNISolone (MEDROL DOSEPACK) 4 MG tablet Take by mouth. 1 kit 0    TESTOSTERONE BU Place inside cheek once a week 3 ml, one time a week.      ibuprofen (ADVIL;MOTRIN) 600 MG tablet Take 1 tablet by mouth as needed       No current facility-administered medications for this visit.        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     Traumatic brain injury [facility](HCC)         Past Surgical History:   Procedure Laterality Date    WISDOM TOOTH EXTRACTION         Family History   Problem Relation Age of Onset    Hypertension Father     Migraines Mother         Frequent migraines since puberty    Migraines Sister         Youngest sibling        Social  History     Tobacco Use    Smoking status: Never    Smokeless tobacco: Never   Vaping Use    Vaping Use: Unknown   Substance Use Topics    Alcohol use: Not Currently     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             Physical Exam    Overall the patient has positive Phalen's, Tinel's and nerve compression test a little more profound on the left than the right side but otherwise is intact light touch sensation, refill and thenar as well as intrinsic strength.    Imaging:    No new x-rays today.      ASSESSMENT/PLAN:  Below is the assessment and plan developed based on review  of pertinent history, physical exam, labs, studies, and medications.    The patient examination was clinically consistent with bilateral left greater than right wrist carpal tunnel syndrome.  Her electrodiagnostic study completed on 05/29/2022 was independent reviewed on 06/06/2022.  This did confirm mild right and moderate left carpal tunnel syndrome but no neuropathy, myopathy or cervical radiculopathy.  I reviewed the treatment findings with the patient and answered her questions.  She was offered but deferred injection therapy and would rather consider surgical treatment.  We also spoke about oral steroids and she would like to trial a Medrol Dosepak and I did review the potential side effects of treatment and answered questions.  She would like to undergo a left than right wrist endoscopic carpal tunnel releases.  I reviewed risks that include but are not limited to stiffness, pain, nerve or tendon damage and overall incomplete relief of pain and paresthesias.  Arrangements can be made for this to be performed on an outpatient basis soon as possible.    1. Bilateral hand pain  2. Carpal tunnel syndrome of left wrist  -     methylPREDNISolone (MEDROL  DOSEPACK) 4 MG tablet; Take by mouth., Disp-1 kit, R-0Normal  3. Carpal tunnel syndrome of right wrist      Return for Follow-up 7-10 days after surgery.     An electronic signature was used to authenticate this note.  -- Leandro Reasoner, MD

## 2022-06-11 NOTE — Telephone Encounter (Signed)
Pt called and wants to schedule surgery. She is trying to get an estimate and since the surgery has not been scheduled she has not been able to get help with that. Please reach out to the patient to discuss options @ [phone].

## 2022-06-13 ENCOUNTER — Encounter

## 2022-06-13 NOTE — Telephone Encounter (Signed)
Pt's surgery was scheduled with [facility] so that financial options may be reviewed with her.

## 2022-06-18 ENCOUNTER — Encounter

## 2022-06-27 ENCOUNTER — Encounter

## 2022-07-02 ENCOUNTER — Encounter

## 2022-07-03 ENCOUNTER — Encounter

## 2022-07-10 NOTE — Telephone Encounter (Signed)
NA

## 2022-07-22 NOTE — Telephone Encounter (Signed)
Pt called re: surgery for right endoscopic carpal tunnel release scheduled for 08/26/22. Pt advises that she no longer has insurance and is anxious about surgery cost. LVM for Insurance Specialist at [facility], requesting that she call the patient as soon as able to advise of any surgery costs so that the pt may acquire the funds. Pt states she has already spoken with [employer] about Arts development officer. May call or come into the office at any time with any questions or concerns.

## 2022-08-23 ENCOUNTER — Encounter

## 2022-08-23 MED ORDER — HYDROCODONE-ACETAMINOPHEN 5-325 MG PO TABS
5-325 MG | ORAL_TABLET | ORAL | 0 refills | Status: DC | PRN
Start: 2022-08-23 — End: 2022-08-23

## 2022-08-23 NOTE — Telephone Encounter (Signed)
Postoperative pain medication prescribed. PMP checked.     Valaria Kohut, PA-C

## 2022-09-03 ENCOUNTER — Encounter: Payer: PRIVATE HEALTH INSURANCE | Attending: Specialist

## 2022-10-07 ENCOUNTER — Encounter

## 2022-10-07 MED ORDER — HYDROCODONE-ACETAMINOPHEN 5-325 MG PO TABS
5-325 | ORAL_TABLET | ORAL | 0 refills | Status: DC | PRN
Start: 2022-10-07 — End: 2022-10-08

## 2022-10-07 NOTE — Telephone Encounter (Signed)
Postoperative pain medication prescribed. PMP checked.     Decoda Van, PA-C

## 2022-10-08 ENCOUNTER — Encounter

## 2023-02-10 ENCOUNTER — Encounter: Admit: 2023-02-10

## 2023-02-10 DIAGNOSIS — Z8679 Personal history of other diseases of the circulatory system: Secondary | ICD-10-CM

## 2023-02-14 ENCOUNTER — Encounter

## 2023-02-14 ENCOUNTER — Inpatient Hospital Stay: Admit: 2023-02-14 | Payer: BLUE CROSS/BLUE SHIELD

## 2023-02-14 DIAGNOSIS — Z8679 Personal history of other diseases of the circulatory system: Secondary | ICD-10-CM

## 2023-02-14 MED ORDER — GADOTERIDOL 279.3 MG/ML IV SOLN
279.3 | Freq: Once | INTRAVENOUS | Status: AC
Start: 2023-02-14 — End: 2023-02-14
  Administered 2023-02-14: 14:00:00 10 mL via INTRAVENOUS

## 2023-02-14 MED FILL — PROHANCE 279.3 MG/ML IV SOLN: 279.3 MG/ML | INTRAVENOUS | Qty: 20

## 2023-05-26 ENCOUNTER — Ambulatory Visit: Payer: BLUE CROSS/BLUE SHIELD | Attending: Neurology
